# Patient Record
Sex: Male | Born: 1965 | Hispanic: Refuse to answer | Marital: Married | Smoking: Current every day smoker
Health system: Southern US, Community
[De-identification: ages and names within clinical notes are randomized; demographics above are authoritative.]

## PROBLEM LIST (undated history)

## (undated) DIAGNOSIS — N400 Enlarged prostate without lower urinary tract symptoms: Secondary | ICD-10-CM

## (undated) HISTORY — DX: Benign prostatic hyperplasia without lower urinary tract symptoms: N40.0

## (undated) HISTORY — PX: OTHER SURGICAL HISTORY: SHX169

---

## 2003-07-27 ENCOUNTER — Emergency Department (HOSPITAL_COMMUNITY): Admission: EM | Admit: 2003-07-27 | Discharge: 2003-07-27 | Payer: Self-pay | Admitting: Emergency Medicine

## 2005-12-02 ENCOUNTER — Emergency Department: Payer: Self-pay

## 2013-10-15 ENCOUNTER — Emergency Department: Payer: Self-pay | Admitting: Emergency Medicine

## 2014-11-08 ENCOUNTER — Emergency Department: Payer: Managed Care, Other (non HMO)

## 2014-11-08 ENCOUNTER — Other Ambulatory Visit: Payer: Self-pay

## 2014-11-08 ENCOUNTER — Emergency Department
Admission: EM | Admit: 2014-11-08 | Discharge: 2014-11-08 | Disposition: A | Discharge status: Home / Self Care | Payer: Managed Care, Other (non HMO) | Attending: Emergency Medicine | Admitting: Emergency Medicine

## 2014-11-08 ENCOUNTER — Encounter: Payer: Self-pay | Admitting: Emergency Medicine

## 2014-11-08 DIAGNOSIS — R339 Retention of urine, unspecified: Secondary | ICD-10-CM | POA: Diagnosis present

## 2014-11-08 DIAGNOSIS — Z720 Tobacco use: Secondary | ICD-10-CM | POA: Diagnosis not present

## 2014-11-08 DIAGNOSIS — N320 Bladder-neck obstruction: Secondary | ICD-10-CM | POA: Insufficient documentation

## 2014-11-08 LAB — BASIC METABOLIC PANEL
ANION GAP: 13 (ref 5–15)
BUN: 11 mg/dL (ref 6–20)
CO2: 20 mmol/L — ABNORMAL LOW (ref 22–32)
Calcium: 9.3 mg/dL (ref 8.9–10.3)
Chloride: 105 mmol/L (ref 101–111)
Creatinine, Ser: 0.91 mg/dL (ref 0.61–1.24)
GFR calc non Af Amer: >60 mL/min (ref >60)
Glucose, Bld: 105 mg/dL — ABNORMAL HIGH (ref 65–99)
Potassium: 3.8 mmol/L (ref 3.5–5.1)
Sodium: 138 mmol/L (ref 135–145)

## 2014-11-08 LAB — URINALYSIS COMPLETE WITH MICROSCOPIC (ARMC ONLY)
BACTERIA UA: NONE SEEN
Bilirubin Urine: NEGATIVE
Glucose, UA: NEGATIVE mg/dL
Ketones, ur: NEGATIVE mg/dL
LEUKOCYTES UA: NEGATIVE
NITRITE: NEGATIVE
PROTEIN: NEGATIVE mg/dL
Specific Gravity, Urine: 1.012 (ref 1.005–1.030)
Squamous Epithelial / LPF: NONE SEEN
pH: 6 (ref 5.0–8.0)

## 2014-11-08 MED ORDER — TAMSULOSIN HCL 0.4 MG PO CAPS
ORAL_CAPSULE | ORAL | Status: AC
  Administered 2014-11-08: 0.4 mg via ORAL
  Filled 2014-11-08: qty 1

## 2014-11-08 MED ORDER — CEPHALEXIN 500 MG PO CAPS
500.0000 mg | ORAL_CAPSULE | ORAL | Status: AC
  Administered 2014-11-08: 500 mg via ORAL

## 2014-11-08 MED ORDER — CEPHALEXIN 500 MG PO CAPS: 500.0000 mg | ORAL_CAPSULE | Freq: Two times a day (BID) | ORAL | Status: DC

## 2014-11-08 MED ORDER — LEVETIRACETAM 500 MG PO TABS
ORAL_TABLET | ORAL | Status: AC
  Filled 2014-11-08: qty 1

## 2014-11-08 MED ORDER — TAMSULOSIN HCL 0.4 MG PO CAPS
0.4000 mg | ORAL_CAPSULE | ORAL | Status: AC
  Administered 2014-11-08: 0.4 mg via ORAL

## 2014-11-08 MED ORDER — CEPHALEXIN 500 MG PO CAPS
ORAL_CAPSULE | ORAL | Status: AC
  Administered 2014-11-08: 500 mg via ORAL
  Filled 2014-11-08: qty 1

## 2014-11-08 MED ORDER — TAMSULOSIN HCL 0.4 MG PO CAPS: 0.4000 mg | ORAL_CAPSULE | Freq: Every day | ORAL | Status: DC

## 2014-11-08 NOTE — ED Notes (Signed)
D/c instructions reviewed w/ pt - pt denies any further questions or concerns at present.  Educated patient on foley catheter care. Demonstrated how to switch between the urinary leg bag and bed bag. Patient and spouse verbalized understanding.

## 2014-11-08 NOTE — ED Notes (Addendum)
Pt reports feeling relief post in and out cath

## 2014-11-08 NOTE — ED Provider Notes (Addendum)
[facility] Emergency Department Provider Note  ____________________________________________  Time seen: Approximately 1:50 PM  I have reviewed the triage vital signs and the nursing notes.   HISTORY  Chief Complaint Urinary Retention    HPI Douglas Collier is a 49 y.o. male who states he woke up around 1 the morning with difficulty urinating. He is only urinate small drops. States he was having no problems urinating over the last several days. No fevers or chills. No back pain. Does have a history of kidney stones, but denies having any blood in his urine or symptoms of a kidney stone.  Otherwise, the patient has no significant medical history. 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     History reviewed. No pertinent past medical history.  There are no active problems to display for this patient.   History reviewed. No pertinent past surgical history.  No current outpatient prescriptions on file.  Allergies Codeine  History reviewed. No pertinent family history.  Social History History  Substance Use Topics  . Smoking status: Current Every Day Smoker  . Smokeless tobacco: Not on file  . Alcohol Use: No    Review of Systems Constitutional: No fever/chills Eyes: No visual changes. ENT: No sore throat. Cardiovascular: Denies chest pain. Respiratory: Denies shortness of breath. Gastrointestinal: No abdominal pain, states he feels like he needs to urinate.  No nausea, no vomiting.  No diarrhea.  No constipation. Genitourinary: No following or urine, no blood in his urine, no pain with urination. States he just can't name. Musculoskeletal: Negative for back pain. Skin: Negative for rash. Neurological: Negative for headaches, focal weakness or numbness.  10-point ROS otherwise negative.  ____________________________________________   PHYSICAL EXAM:  VITAL SIGNS: ED Triage Vitals  Enc Vitals Group     BP 11/08/14  1323 131/103 mmHg     Pulse Rate 11/08/14 1323 103     Resp 11/08/14 1323 24     Temp 11/08/14 1323 97.4 F (36.3 C)     Temp Source 11/08/14 1323 Oral     SpO2 11/08/14 1323 90 %     Weight 11/08/14 1323 225 lb (102.059 kg)     Height 11/08/14 1323 6\' 1"  (1.854 m)     Head Cir --      Peak Flow --      Pain Score 11/08/14 1314 10     Pain Loc --      Pain Edu? --      Excl. in GC? --     Constitutional: Alert and oriented. Well appearing and in no acute distress. Eyes: Conjunctivae are normal. PERRL. EOMI. Head: Atraumatic. Nose: No congestion/rhinnorhea. Mouth/Throat: Mucous membranes are moist.  Oropharynx non-erythematous. Neck: No stridor.   Cardiovascular: Normal rate, regular rhythm. Grossly normal heart sounds.  Good peripheral circulation. Respiratory: Normal respiratory effort.  No retractions. Lungs CTAB. Gastrointestinal: Soft and nontender. No distention. No abdominal bruits. No CVA tenderness. Genitourinary: Normal-appearing circumcised penis. Testicles nontender and descended bilaterally. There is fullness and prominence over the suprapubic area, and palpation makes the patient feels that he needs to urinate. Musculoskeletal: No lower extremity tenderness nor edema.  No joint effusions. Neurologic:  Normal speech and language. No gross focal neurologic deficits are appreciated. Speech is normal.  Skin:  Skin is warm, dry and intact. No rash noted. Psychiatric: Mood and affect are normal. Speech and behavior are normal.  ____________________________________________   LABS (all labs ordered are listed, but  only abnormal results are displayed)  Labs Reviewed  BASIC METABOLIC PANEL - Abnormal; Notable for the following:    CO2 20 (*)    Glucose, Bld 105 (*)    All other components within normal limits  URINALYSIS COMPLETEWITH MICROSCOPIC ([facility] ONLY) - Abnormal; Notable for the following:    Color, Urine YELLOW (*)    APPearance CLEAR (*)    Hgb urine dipstick  2+ (*)    All other components within normal limits   ____________________________________________  EKG   ____________________________________________  RADIOLOGY  CLINICAL DATA: Urinary retention. Bladder outlet obstruction.  EXAM: RENAL / URINARY TRACT ULTRASOUND COMPLETE  COMPARISON: None.  FINDINGS: Right Kidney:  Length: 10.2 cm. Echogenicity within normal limits. No mass or hydronephrosis visualized.  Left Kidney:  Length: 11.6 cm. Echogenicity within normal limits. No mass or hydronephrosis visualized.  Bladder:  The patient was catheterized immediately prior to ultrasound and 1150 cc of urine or removed. At the time of ultrasound, the bladder was completely collapsed.  IMPRESSION: 1. Currently the bladder is empty. The patient was catheterized for drainage of the bladder immediately prior to today's ultrasound. 2. No hydronephrosis or stones. The kidneys appear sonographically unremarkable. ____________________________________________   PROCEDURES  Procedure(s) performed: None  Critical Care performed: No  ____________________________________________   INITIAL IMPRESSION / ASSESSMENT AND PLAN / ED COURSE  Pertinent labs & imaging results that were available during my care of the patient were reviewed by me and considered in my medical decision making (see chart for details).  Patient presents with difficulty voiding. His symptoms that suggest bilateral type obstruction. He does not have any nausea or vomiting. No fevers. He has no previous history of bladder outlet obstruction. Because of a history of kidney stones, but does not seem to have symptoms of. At this point, most likely etiologies include prostate enlargement, prostate cancer, bladder tumor, or other obstructive etiology.  This is the first time this happened, we will in an out the patient. Trial voiding thereafter, the patient is unable to we will have to insert a Foley  catheter.  I'll obtain renal ultrasound and basic metabolic panel to check creatinine and urinalysis. ____________________________________________ ----------------------------------------- 2:57 PM on 11/08/2014 -----------------------------------------  Patient catheterized for large volume of clear yellow urine. His symptoms have resolved after catheterization. We will attempt to allow the patient to urinate, should he have a large postvoid residual or be unable to urinate again we will insert a Foley catheter. Urinalysis does not suggest infection. Creatinine normal.  ----------------------------------------- 4:03 PM on 11/08/2014 -----------------------------------------  Patient reports that he again is feeling an urge to urinate, but in able. He states he is having a slight trickle again. At this point I would say the patient has failed attempt at voiding. We will insert a Foley catheter. I'll put him on prophylactic cephalexin and Flomax. I described to the patient treatment recommendations, return precautions, and follow up the urology. He agrees to follow up with urology. In addition, he'll return to the ER or acute care clinic for reevaluation in a week. Return precautions advised.  Did discuss with the patient importance of urology care as he does need further evaluation to make sure that he does not have a bladder tumor or mass such as early signs of prostate cancer which could also be causing his symptoms.  FINAL CLINICAL IMPRESSION(S) / ED DIAGNOSES  Final diagnoses:  Bladder outlet obstruction   Discharge. Much improved.   Sharyn Creamer, MD 11/08/14 1606  The patient did  request a work note. I will provide this. The patient did wish for me to specifically comment that he has a catheter, which I have placed on his work now with his permission.  Sharyn Creamer, MD 11/08/14 [phone]

## 2014-11-08 NOTE — ED Notes (Signed)
Unable to urinate since 1am

## 2014-11-08 NOTE — ED Notes (Signed)
Dr. Fanny Bien notified that in and out catheter was preformed.  1150 ml of urine drained.  72 ml of urine remains per bladder scan results.

## 2014-11-08 NOTE — ED Notes (Signed)
Pt reports this morning being unable to void.  He reports straining and only getting drips of urine to pass.  Pt complains of fullness in lower abdomen.  Patient last normal BM was 11-07-14.  Pt reports small BM this morning but since he has been straining to urinate he has blood per rectum when he wipes.

## 2014-11-08 NOTE — Discharge Instructions (Signed)
Please return to the ER or see urology in one week for reevaluation of your catheter and evaluation for removal.  Return to the ER right away if he develop fever, feel you  cant urinate again, have vomiting, abdominal pain, or other new concerns arise.

## 2014-11-14 ENCOUNTER — Encounter: Payer: Self-pay | Admitting: Urology

## 2014-11-14 ENCOUNTER — Ambulatory Visit (INDEPENDENT_AMBULATORY_CARE_PROVIDER_SITE_OTHER): Payer: Managed Care, Other (non HMO) | Admitting: Urology

## 2014-11-14 VITALS — BP 138/91 | HR 92 | Ht 73.0 in | Wt 227.0 lb

## 2014-11-14 DIAGNOSIS — N400 Enlarged prostate without lower urinary tract symptoms: Secondary | ICD-10-CM | POA: Diagnosis not present

## 2014-11-14 DIAGNOSIS — N401 Enlarged prostate with lower urinary tract symptoms: Secondary | ICD-10-CM | POA: Insufficient documentation

## 2014-11-14 DIAGNOSIS — R338 Other retention of urine: Secondary | ICD-10-CM | POA: Insufficient documentation

## 2014-11-14 MED ORDER — FINASTERIDE 5 MG PO TABS: 5.0000 mg | ORAL_TABLET | Freq: Every day | ORAL | Status: DC

## 2014-11-14 MED ORDER — TAMSULOSIN HCL 0.4 MG PO CAPS: 0.4000 mg | ORAL_CAPSULE | Freq: Every day | ORAL | Status: DC

## 2014-11-14 NOTE — Progress Notes (Signed)
Catheter Removal  Patient is present today for a catheter removal.  4ml of water was drained from the balloon. A 16FR foley cath was removed from the bladder no complications were noted . Patient tolerated well.  Preformed by: Natividad Brood, CMA  Follow up/ Additional notes: Pt. was instructed to call the clinic if he is unable to void or has difficulty voiding.

## 2014-11-14 NOTE — Progress Notes (Signed)
11/14/2014 8:16 AM   Douglas Collier Douglas Collier Jun 26, 1965 [id number]  Referring provider: No referring provider defined for this encounter.  Chief Complaint  Patient presents with  . Urinary Retention    Follow up from ER pt has foley    HPI: Mr. Douglas Collier is a 49 year old white male who presented to the ED on 11/08/2014 with the complete inability to urinate.  He states he was having no problems urinating prior to this incident.  He woke up that morning and went to urinate and just a dribble came out.  He waited for a minute and the dribbling just continued.  Then he started to develop suprapubic pain and went to the [facility] ED.  A foley was placed  and he had 1150 cc drained from his bladder.  He was placed on Flomax and discharged with the foley catheter and instructed to follow up with [place].    He has not had any UTI's, fevers, chills, nausea, vomiting or gross hematuria.  Patient's wife stated that he had a similar incident about four years ago, but he was able to urinate on his own after a few hours.   His IPSS score today is 5/3 (mild).  He is mixed about his urinary symptoms today because he wants the catheter out.  He is still taking the Flomax.    He has no PCP and has never had a prostate exam or a PSA.          IPSS      11/14/14 0800       International Prostate Symptom Score   How often have you had the sensation of not emptying your bladder? Not at All     How often have you had to urinate less than every two hours? Not at All     How often have you found you stopped and started again several times when you urinated? Not at All     How often have you found it difficult to postpone urination? Less than 1 in 5 times     How often have you had a weak urinary stream? Less than half the time     How often have you had to strain to start urination? Not at All     How many times did you typically get up at night to urinate? 1 Time     Total IPSS Score 4     Quality of Life due to  urinary symptoms   If you were to spend the rest of your life with your urinary condition just the way it is now how would you feel about that? Mixed        Score:  1-7 Mild 8-19 Moderate 20-35 Severe  PMH: No past medical history on file.  Surgical History: No past surgical history on file.  Home Medications:    Medication List       This list is accurate as of: 11/14/14  8:16 AM.  Always use your most recent med list.               cephALEXin 500 MG capsule  Commonly known as:  KEFLEX  Take 1 capsule (500 mg total) by mouth 2 (two) times daily.     tamsulosin 0.4 MG Caps capsule  Commonly known as:  FLOMAX  Take 1 capsule (0.4 mg total) by mouth daily.        Allergies:  Allergies  Allergen Reactions  . Codeine Rash  Family History: No family history on file.  Social History:  reports that he has been smoking.  He does not have any smokeless tobacco history on file. He reports that he does not drink alcohol. His drug history is not on file.  ROS: Urological Symptom Review  Patient is experiencing the following symptoms: Hard to postpone urination Get up at night to urinate Have to strain to urinate Weak stream   Review of Systems  Gastrointestinal (upper)  : Negative for upper GI symptoms  Gastrointestinal (lower) : Negative for lower GI symptoms  Constitutional : Negative for symptoms  Skin: Negative for skin symptoms  Eyes: Negative for eye symptoms  Ear/Nose/Throat : Negative for Ear/Nose/Throat symptoms  Hematologic/Lymphatic: Negative for Hematologic/Lymphatic symptoms  Cardiovascular : Negative for cardiovascular symptoms  Respiratory : Negative for respiratory symptoms  Endocrine: Negative for endocrine symptoms  Musculoskeletal: Negative for musculoskeletal symptoms  Neurological: Negative for neurological symptoms  Psychologic: Negative for psychiatric symptoms   Physical Exam: BP 138/91 mmHg  Pulse 92   Ht  (1.854 m)  Wt 227 lb (102.967 kg)  BMI 29.96 kg/m2  Constitutional:  Alert and oriented, No acute distress. HEENT: [name], moist mucus membranes.  Trachea midline, no masses. Cardiovascular: No clubbing, cyanosis, or edema. Respiratory: Normal respiratory effort, no increased work of breathing. GI: Abdomen is soft, nontender, nondistended, no abdominal masses GU: No CVA tenderness.  Patient with circumcised phallus. Foley catheter in place. Urethral meatus is patent.  No penile discharge. No penile lesions or rashes. Scrotum without lesions, cysts, rashes and/or edema.  Testicles are located scrotally bilaterally. No masses are appreciated in the testicles. Left and right epididymis are normal.  Rectal: Patient with  normal sphincter tone. Perineum without scarring or rashes. No rectal masses are appreciated. Friable external hemorrhoids.  Prostate is approximately 60 grams, no nodules are appreciated. Seminal vesicles are normal. Skin: No rashes, bruises or suspicious lesions. Lymph: No cervical or inguinal adenopathy. Neurologic: Grossly intact, no focal deficits, moving all 4 extremities. Psychiatric: Normal mood and affect.  Laboratory Data: No results found for: WBC, HGB, HCT, MCV, PLT  Lab Results  Component Value Date   CREATININE 0.91 11/08/2014    No results found for: PSA  No results found for: TESTOSTERONE  No results found for: HGBA1C  Urinalysis    Component Value Date/Time   COLORURINE YELLOW* 11/08/2014 1355   APPEARANCEUR CLEAR* 11/08/2014 1355   LABSPEC 1.012 11/08/2014 1355   PHURINE 6.0 11/08/2014 1355   GLUCOSEU NEGATIVE 11/08/2014 1355   HGBUR 2+* 11/08/2014 1355   BILIRUBINUR NEGATIVE 11/08/2014 1355   KETONESUR NEGATIVE 11/08/2014 1355   PROTEINUR NEGATIVE 11/08/2014 1355   NITRITE NEGATIVE 11/08/2014 1355   LEUKOCYTESUR NEGATIVE 11/08/2014 1355    Pertinent Imaging: CLINICAL DATA: Urinary retention. Bladder outlet  obstruction.  EXAM: RENAL / URINARY TRACT ULTRASOUND COMPLETE  COMPARISON: None.  FINDINGS: Right Kidney:  Length: 10.2 cm. Echogenicity within normal limits. No mass or hydronephrosis visualized.  Left Kidney:  Length: 11.6 cm. Echogenicity within normal limits. No mass or hydronephrosis visualized.  Bladder:  The patient was catheterized immediately prior to ultrasound and 1150 cc of urine or removed. At the time of ultrasound, the bladder was completely collapsed.  IMPRESSION: 1. Currently the bladder is empty. The patient was catheterized for drainage of the bladder immediately prior to today's ultrasound. 2. No hydronephrosis or stones. The kidneys appear sonographically unremarkable.   Electronically Signed  By: Gaylyn Rong M.D.  On: 11/08/2014 14:55  Assessment & Plan:    1. Acute urinary retention:  Patient had a foley catheter placed in the ED on 11/08/2014 with a return of 1150 cc of urine.  Patient had a similar episode about four years ago, but he was eventually was able to urinate on his own.  He is an over the road trunk driver.  He does not have a PCP.  He has no prior PSA's and he has not had his prostate examined in the past.    2. BPH with urinary retention:  Patient was placed on Flomax by the ED and he will continue to take that medication.  Patient's IPSS score is 5/3.  His DRE demonstrates enlargement.  We will add finasteride.  The side effects of finasteride are also discussed with the patient, such as: impotence, loss of interest in sex, or trouble having an orgasm; abnormal ejaculation; swelling in your hands and/or feet; swelling and/or tenderness in your breasts.  His foley catheter is removed today.   He is instructed to return to the office this afternoon if he is unable to void.  He will follow up in one month for a PSA, PVR and an IPSS.     There are no diagnoses linked to this encounter.  No Follow-up on  file.  Michiel Cowboy, PA-C  [facility] [address], [address] [place], [place] [address] [phone]

## 2014-12-16 ENCOUNTER — Ambulatory Visit (INDEPENDENT_AMBULATORY_CARE_PROVIDER_SITE_OTHER): Payer: Managed Care, Other (non HMO) | Admitting: Urology

## 2014-12-16 ENCOUNTER — Encounter: Payer: Self-pay | Admitting: Urology

## 2014-12-16 VITALS — BP 134/81 | HR 90 | Ht 73.0 in | Wt 232.0 lb

## 2014-12-16 DIAGNOSIS — N401 Enlarged prostate with lower urinary tract symptoms: Secondary | ICD-10-CM

## 2014-12-16 DIAGNOSIS — N400 Enlarged prostate without lower urinary tract symptoms: Secondary | ICD-10-CM | POA: Diagnosis not present

## 2014-12-16 DIAGNOSIS — R338 Other retention of urine: Principal | ICD-10-CM

## 2014-12-16 LAB — BLADDER SCAN AMB NON-IMAGING

## 2014-12-16 NOTE — Progress Notes (Signed)
12/16/2014 3:17 PM   Douglas Collier [birth year] [id number]  Referring provider: Hillery Aldo, MD [address] [place], [place] [address]  Chief Complaint  Patient presents with  . Benign Prostatic Hypertrophy    45month    HPI: Douglas Collier is a 49 year old white male who is ended to [place] 1 month ago after having a Foley catheter placed for urinary retention. I started him on finasteride and tamsulosin and he presents today for symptom recheck and PVR.  His IPSS score today is 4, which is mild lower urinary tract symptomatology. He is pleased with his quality life due to his urinary symptoms. His PVR is 44 mL.  His previous IPSS score was 4/3.    His states he is urinating well without difficulty. He is also notices nocturia has diminished.   He denies any dysuria, hematuria or suprapubic pain.   Patient does not have any previous PSA's:     He also denies any recent fevers, chills, nausea or vomiting.  He does not have a family history of PCa.      IPSS      11/14/14 0800 12/16/14 1500     International Prostate Symptom Score   How often have you had the sensation of not emptying your bladder? Not at All Not at All    How often have you had to urinate less than every two hours? Not at All Less than half the time    How often have you found you stopped and started again several times when you urinated? Not at All Not at All    How often have you found it difficult to postpone urination? Less than 1 in 5 times Not at All    How often have you had a weak urinary stream? Less than half the time Not at All    How often have you had to strain to start urination? Not at All Not at All    How many times did you typically get up at night to urinate? 1 Time 2 Times    Total IPSS Score 4 4    Quality of Life due to urinary symptoms   If you were to spend the rest of your life with your urinary condition just the way it is now how would you feel about that? Mixed Pleased        Score:  1-7 Mild 8-19 Moderate 20-35 Severe     PMH: No past medical history on file.  Surgical History: Past Surgical History  Procedure Laterality Date  . Arm surgery Left     radius/ulnar    Home Medications:    Medication List       This list is accurate as of: 12/16/14  3:17 PM.  Always use your most recent med list.               aspirin 81 MG tablet  Take 81 mg by mouth daily.     finasteride 5 MG tablet  Commonly known as:  PROSCAR  Take 1 tablet (5 mg total) by mouth daily.     tamsulosin 0.4 MG Caps capsule  Commonly known as:  FLOMAX  Take 1 capsule (0.4 mg total) by mouth daily.        Allergies:  Allergies  Allergen Reactions  . Codeine Rash    Family History: Family History  Problem Relation Age of Onset  . Other Mother     Genetic Disorder also 2  Brothers  . Cancer Neg Hx     Kidney Disease  . Cancer Neg Hx     Prostate Disease    Social History:  reports that he has been smoking.  He does not have any smokeless tobacco history on file. He reports that he does not drink alcohol or use illicit drugs.  ROS: UROLOGY Frequent Urination?: No Hard to postpone urination?: No Burning/pain with urination?: No Get up at night to urinate?: No Leakage of urine?: No Urine stream starts and stops?: No Trouble starting stream?: No Do you have to strain to urinate?: No Blood in urine?: No Urinary tract infection?: No Sexually transmitted disease?: No Injury to kidneys or bladder?: No Painful intercourse?: No Weak stream?: No Erection problems?: No Penile pain?: No  Gastrointestinal Nausea?: No Vomiting?: No Indigestion/heartburn?: No Diarrhea?: No Constipation?: No  Constitutional Fever: No Night sweats?: No Weight loss?: No Fatigue?: No  Skin Skin rash/lesions?: No Itching?: No  Eyes Blurred vision?: No Double vision?: No  Ears/Nose/Throat Sore throat?: No Sinus problems?: No  Hematologic/Lymphatic Swollen  glands?: No Easy bruising?: No  Cardiovascular Leg swelling?: No Chest pain?: No  Respiratory Cough?: No Shortness of breath?: No  Endocrine Excessive thirst?: No  Musculoskeletal Back pain?: No Joint pain?: No  Neurological Headaches?: No Dizziness?: No  Psychologic Depression?: No Anxiety?: No  Physical Exam: BP 134/81 mmHg  Pulse 90  Ht  (1.854 m)  Wt 232 lb (105.235 kg)  BMI 30.62 kg/m2   Laboratory Data: Results for orders placed or performed in visit on 12/16/14  BLADDER SCAN AMB NON-IMAGING  Result Value Ref Range   Scan Result 44ml    No results found for: WBC, HGB, HCT, MCV, PLT  Lab Results  Component Value Date   CREATININE 0.91 11/08/2014    No results found for: PSA  No results found for: TESTOSTERONE  No results found for: HGBA1C  Urinalysis    Component Value Date/Time   COLORURINE YELLOW* 11/08/2014 1355   APPEARANCEUR CLEAR* 11/08/2014 1355   LABSPEC 1.012 11/08/2014 1355   PHURINE 6.0 11/08/2014 1355   GLUCOSEU NEGATIVE 11/08/2014 1355   HGBUR 2+* 11/08/2014 1355   BILIRUBINUR NEGATIVE 11/08/2014 1355   KETONESUR NEGATIVE 11/08/2014 1355   PROTEINUR NEGATIVE 11/08/2014 1355   NITRITE NEGATIVE 11/08/2014 1355   LEUKOCYTESUR NEGATIVE 11/08/2014 1355    Pertinent Imaging:   Assessment & Plan:    1. BPH (benign prostatic hypertrophy) with urinary retention:   Patient's IPSS score is 4/2.  His PVR 44 mL.  His DRE demonstrates enlargement. If his PSA is normal for his age group,  he will follow up in one year for a PSA, DRE, PVR and an IPSS.    - PSA - BLADDER SCAN AMB NON-IMAGING   No Follow-up on file.  Michiel Cowboy, PA-C  [facility] [address], [address] [place], [place] [address] [phone]

## 2014-12-17 ENCOUNTER — Telehealth: Payer: Self-pay

## 2014-12-17 DIAGNOSIS — R972 Elevated prostate specific antigen [PSA]: Secondary | ICD-10-CM

## 2014-12-17 LAB — PSA: PROSTATE SPECIFIC AG, SERUM: 2 ng/mL (ref 0.0–4.0)

## 2014-12-17 NOTE — Telephone Encounter (Signed)
Spoke with pt wife in reference to lab results. Wife voiced understanding. Orders have been placed.

## 2014-12-17 NOTE — Telephone Encounter (Signed)
-----   Message from Harle Battiest, PA-C sent at 12/17/2014  8:00 AM EDT ----- Patient's PSA is stable.  We will see him in 12 months.  PSA to be drawn before his next appointment.

## 2015-11-25 ENCOUNTER — Other Ambulatory Visit: Payer: Self-pay | Admitting: Urology

## 2015-12-15 ENCOUNTER — Other Ambulatory Visit: Payer: Self-pay

## 2015-12-15 DIAGNOSIS — N400 Enlarged prostate without lower urinary tract symptoms: Secondary | ICD-10-CM

## 2015-12-16 ENCOUNTER — Other Ambulatory Visit: Payer: Managed Care, Other (non HMO)

## 2015-12-16 DIAGNOSIS — N400 Enlarged prostate without lower urinary tract symptoms: Secondary | ICD-10-CM

## 2015-12-17 LAB — PSA: Prostate Specific Ag, Serum: 1.8 ng/mL (ref 0.0–4.0)

## 2015-12-21 NOTE — Progress Notes (Signed)
12/22/2015 4:05 PM   Douglas Collier December 30, 1965 [id number]  Referring provider: Hillery Aldo, MD [address] [place], [place] [address]  Chief Complaint  Patient presents with  . Benign Prostatic Hypertrophy    1 year follow up     HPI: Patient is a 50 year old Caucasian male with a history of urinary retention and BPH with LUTS.  History of urinary retention Patient had an incident of urinary retention that required Foley catheterization over one year ago.  He has been on finasteride and tamsulosin and is voiding well.  BPH WITH LUTS His IPSS score today is 1, which is mild lower urinary tract symptomatology. He is mostly satisfied with his quality life due to his urinary symptoms.  His previous IPSS score was 4/2.  His previous PVR is 44 mL.  His major complaint today is ED.  He has had these symptoms for several years.  He denies any dysuria, hematuria or suprapubic pain.  He currently taking tamsulosin 0.4 mg daily and finasteride 5 mg daily.  He also denies any recent fevers, chills, nausea or vomiting.  He does not have a family history of PCa.      IPSS    Row Name 12/22/15 1500         International Prostate Symptom Score   How often have you had the sensation of not emptying your bladder? Not at All     How often have you had to urinate less than every two hours? Not at All     How often have you found you stopped and started again several times when you urinated? Not at All     How often have you found it difficult to postpone urination? Not at All     How often have you had a weak urinary stream? Not at All     How often have you had to strain to start urination? Not at All     How many times did you typically get up at night to urinate? 1 Time     Total IPSS Score 1       Quality of Life due to urinary symptoms   If you were to spend the rest of your life with your urinary condition just the way it is now how would you feel about that? Mostly  Satisfied        Score:  1-7 Mild 8-19 Moderate 20-35 Severe     PMH: Past Medical History:  Diagnosis Date  . BPH (benign prostatic hyperplasia)     Surgical History: Past Surgical History:  Procedure Laterality Date  . Arm surgery Left    radius/ulnar    Home Medications:    Medication List       Accurate as of 12/22/15  4:05 PM. Always use your most recent med list.          aspirin 81 MG tablet Take 81 mg by mouth daily.   finasteride 5 MG tablet Commonly known as:  PROSCAR TAKE 1 TABLET (5 MG TOTAL) BY MOUTH DAILY.   tamsulosin 0.4 MG Caps capsule Commonly known as:  FLOMAX TAKE 1 CAPSULE (0.4 MG TOTAL) BY MOUTH DAILY.       Allergies:  Allergies  Allergen Reactions  . Codeine Rash    Family History: Family History  Problem Relation Age of Onset  . Other Mother     Genetic Disorder also 2 Brothers  . Cancer Neg Hx     Kidney Disease  Social History:  reports that he has been smoking.  He has never used smokeless tobacco. He reports that he does not drink alcohol or use drugs.  ROS: UROLOGY Frequent Urination?: No Hard to postpone urination?: No Burning/pain with urination?: No Get up at night to urinate?: No Leakage of urine?: No Urine stream starts and stops?: No Trouble starting stream?: No Do you have to strain to urinate?: No Blood in urine?: No Urinary tract infection?: No Sexually transmitted disease?: No Injury to kidneys or bladder?: No Painful intercourse?: No Weak stream?: No Erection problems?: Yes Penile pain?: No  Gastrointestinal Nausea?: No Vomiting?: No Indigestion/heartburn?: No Diarrhea?: No Constipation?: No  Constitutional Fever: No Night sweats?: No Weight loss?: No Fatigue?: No  Skin Skin rash/lesions?: No Itching?: No  Eyes Blurred vision?: No Double vision?: No  Ears/Nose/Throat Sore throat?: No Sinus problems?: No  Hematologic/Lymphatic Swollen glands?: No Easy bruising?:  No  Cardiovascular Leg swelling?: No Chest pain?: No  Respiratory Cough?: No Shortness of breath?: No  Endocrine Excessive thirst?: No  Musculoskeletal Back pain?: No Joint pain?: No  Neurological Headaches?: No Dizziness?: No  Psychologic Depression?: No Anxiety?: No  Physical Exam: BP 122/82   Pulse 89   Ht 6' (1.829 m)   Wt 221 lb 1.6 oz (100.3 kg)   BMI 29.99 kg/m   Constitutional: Well nourished. Alert and oriented, No acute distress. HEENT: [place] AT, moist mucus membranes. Trachea midline, no masses. Cardiovascular: No clubbing, cyanosis, or edema. Respiratory: Normal respiratory effort, no increased work of breathing. GI: Abdomen is soft, non tender, non distended, no abdominal masses. Liver and spleen not palpable.  No hernias appreciated.  Stool sample for occult testing is not indicated.   GU: No CVA tenderness.  No bladder fullness or masses.  Patient with circumcised phallus.  Urethral meatus is patent.  No penile discharge. No penile lesions or rashes. Scrotum without lesions, cysts, rashes and/or edema.  Testicles are located scrotally bilaterally. No masses are appreciated in the testicles. Left and right epididymis are normal. Rectal: Refused.   Skin: No rashes, bruises or suspicious lesions. Lymph: No cervical or inguinal adenopathy. Neurologic: Grossly intact, no focal deficits, moving all 4 extremities. Psychiatric: Normal mood and affect.  Laboratory Data: Results for orders placed or performed in visit on 12/16/15  PSA  Result Value Ref Range   Prostate Specific Ag, Serum 1.8 0.0 - 4.0 ng/mL     Lab Results  Component Value Date   CREATININE 0.91 11/08/2014     Assessment & Plan:    1. History of urinary retention:   Voiding well. Continue tamsulosin and finasteride. Return to clinic in 1 year for I PSS score.  2. BPH with LUTS  - IPSS score is 1/2, it is improving  - Continue tamsulosin 0.4 mg daily and finasteride 5 mg daily  -  RTC in 12 months for IPSS, PSA and exam      Return in about 1 year (around 12/21/2016) for IPSS, PSA and exam.  Michiel Cowboy, [facility]  [facility] [address] [place], [place] [address] [phone]

## 2015-12-22 ENCOUNTER — Ambulatory Visit (INDEPENDENT_AMBULATORY_CARE_PROVIDER_SITE_OTHER): Payer: Managed Care, Other (non HMO) | Admitting: Urology

## 2015-12-22 ENCOUNTER — Encounter: Payer: Self-pay | Admitting: Urology

## 2015-12-22 VITALS — BP 122/82 | HR 89 | Ht 72.0 in | Wt 221.1 lb

## 2015-12-22 DIAGNOSIS — Z87448 Personal history of other diseases of urinary system: Secondary | ICD-10-CM | POA: Diagnosis not present

## 2015-12-22 DIAGNOSIS — Z87898 Personal history of other specified conditions: Secondary | ICD-10-CM

## 2015-12-22 DIAGNOSIS — N400 Enlarged prostate without lower urinary tract symptoms: Secondary | ICD-10-CM

## 2015-12-22 DIAGNOSIS — N401 Enlarged prostate with lower urinary tract symptoms: Secondary | ICD-10-CM

## 2015-12-22 DIAGNOSIS — R338 Other retention of urine: Principal | ICD-10-CM

## 2015-12-22 MED ORDER — FINASTERIDE 5 MG PO TABS: ORAL_TABLET | ORAL | 4 refills | Status: DC

## 2015-12-22 MED ORDER — TAMSULOSIN HCL 0.4 MG PO CAPS: ORAL_CAPSULE | ORAL | 4 refills | Status: DC

## 2015-12-26 ENCOUNTER — Telehealth: Payer: Self-pay

## 2015-12-26 NOTE — Telephone Encounter (Signed)
PA for finasteride approved x65yr. Approval #- [id number]

## 2015-12-29 ENCOUNTER — Telehealth: Payer: Self-pay

## 2015-12-29 NOTE — Telephone Encounter (Signed)
PA for finasteride has been APPROVED.  

## 2016-06-30 IMAGING — US US RENAL
1 series · 14 of 25 positions shown · non-contrast
Comparison: None.

CLINICAL DATA: Urinary retention.  Bladder outlet obstruction.

EXAM:
RENAL / URINARY TRACT ULTRASOUND COMPLETE

[Series 1: us renal · 0.24mm/px · 14 of 25 slices shown]
[im 1/25]
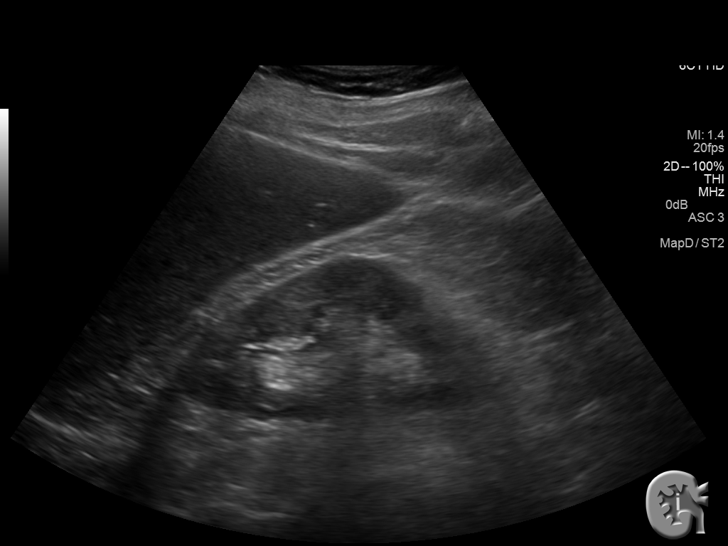
[im 3/25]
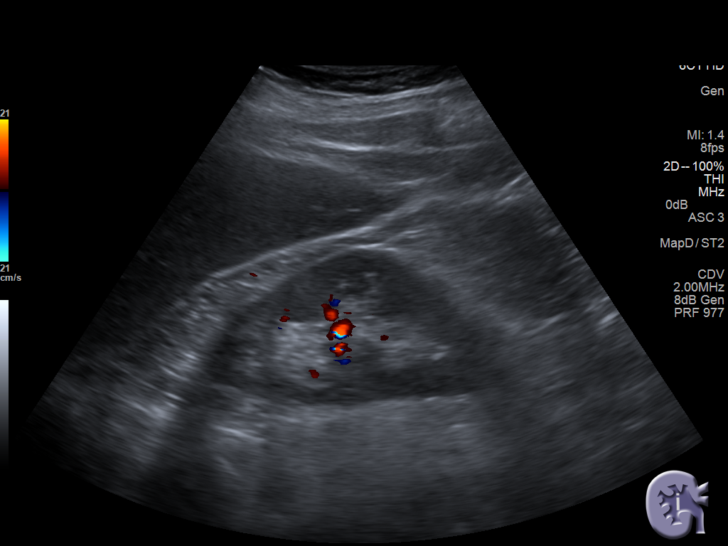
[im 5/25]
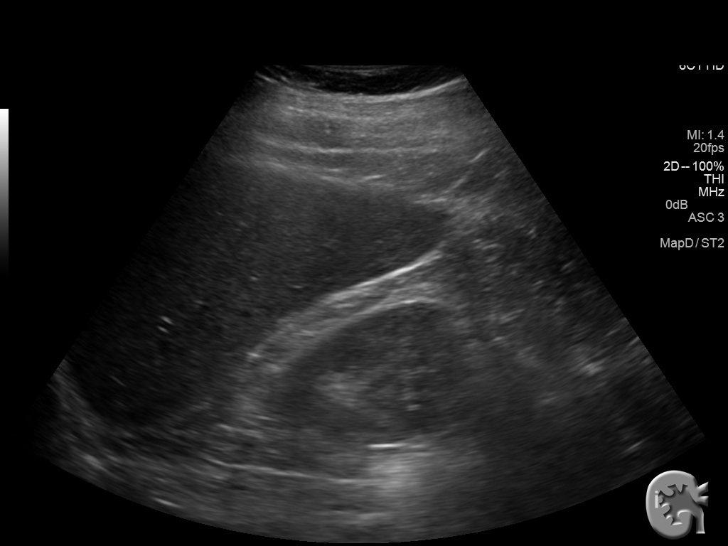
[im 7/25]
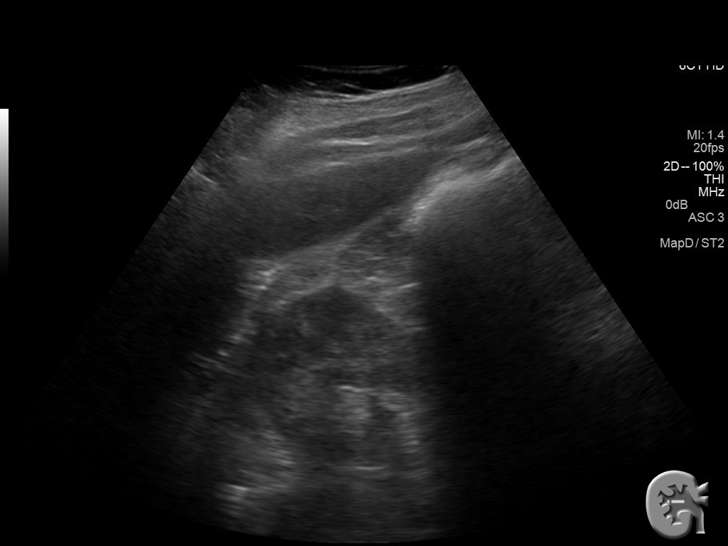
[im 9/25]
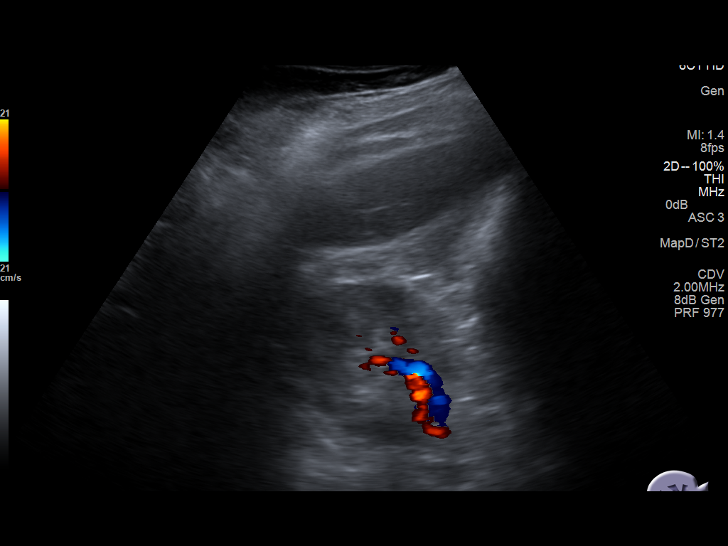
[im 10/25]
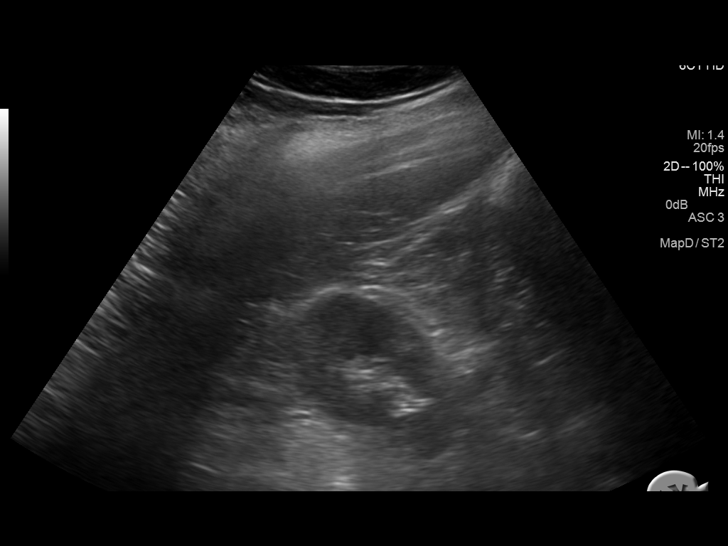
[im 12/25]
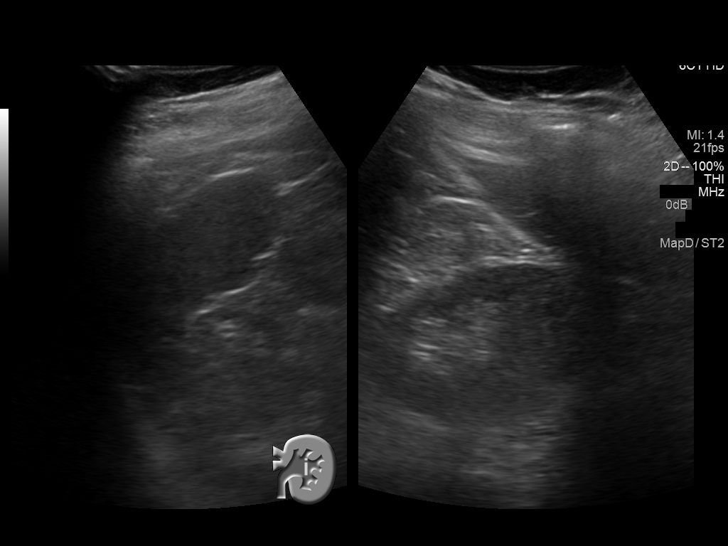
[im 14/25]
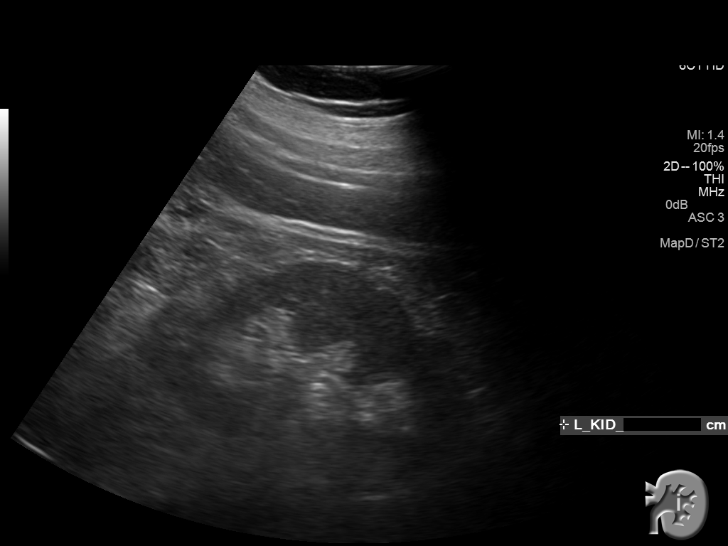
[im 16/25]
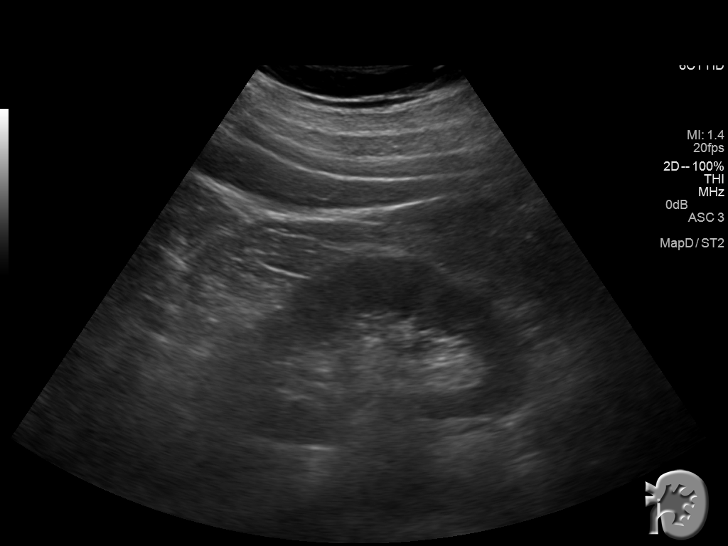
[im 17/25]
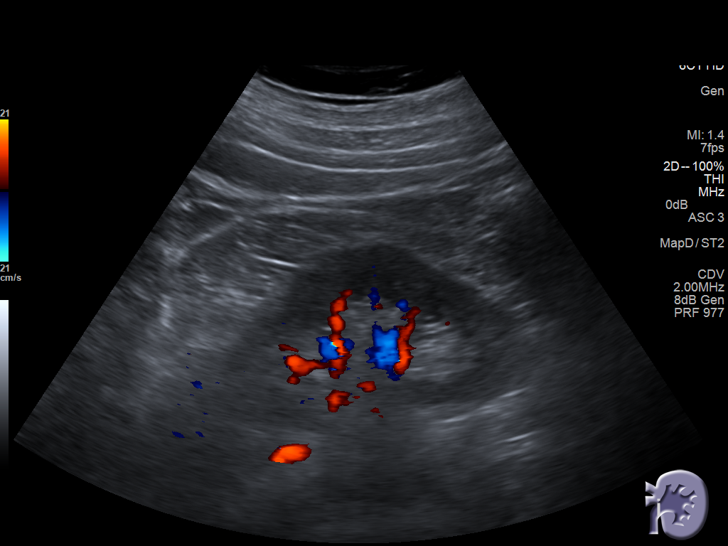
[im 19/25]
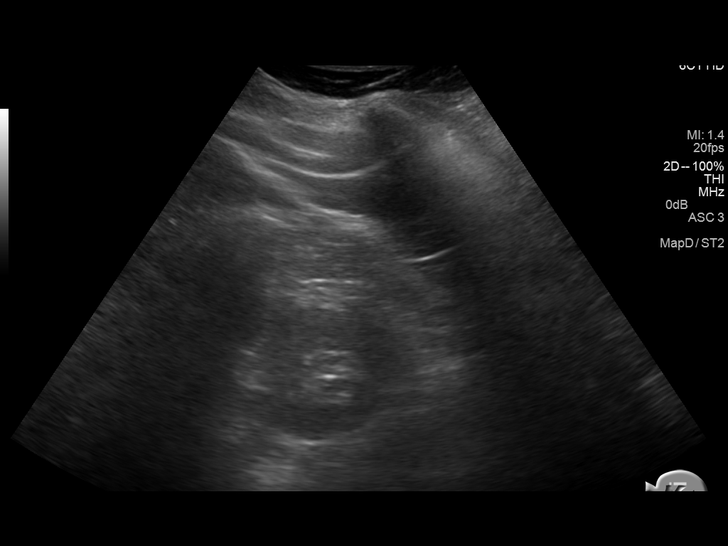
[im 21/25]
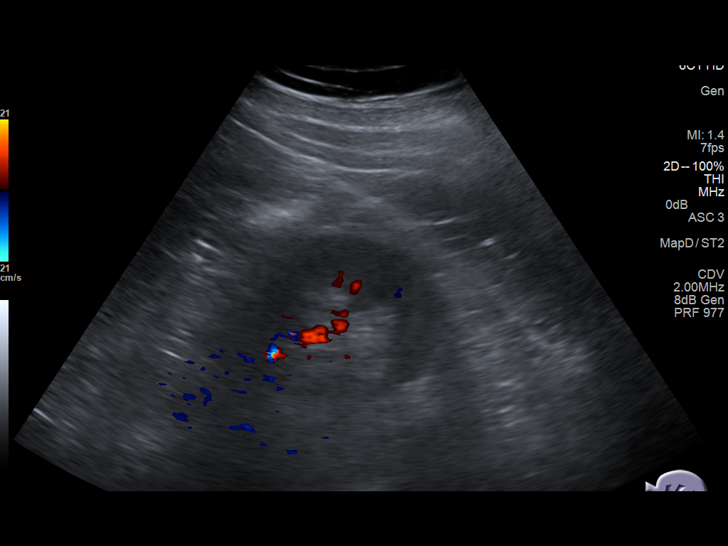
[im 23/25]
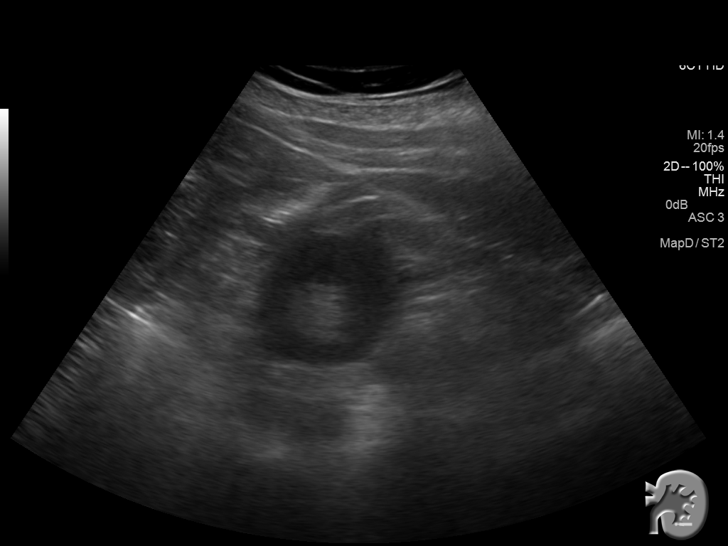
[im 25/25]
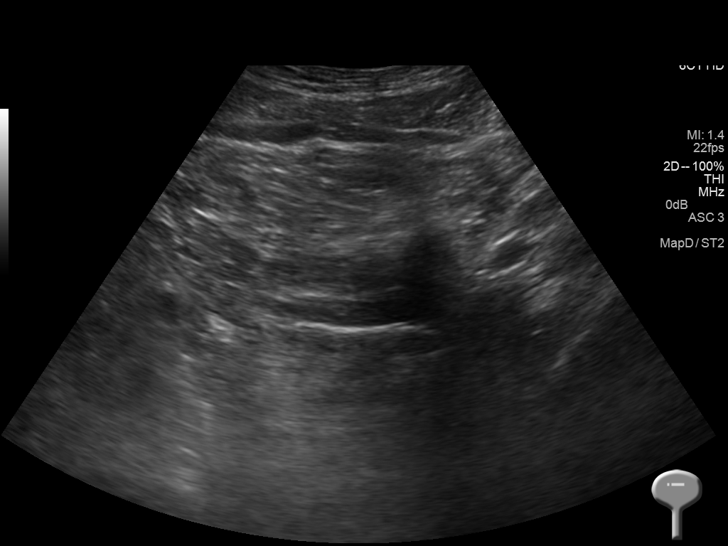

[14 of 25 positions shown; findings below may reference images not displayed]

FINDINGS: Right Kidney:

Length: 10.2 cm. Echogenicity within normal limits. No mass or
hydronephrosis visualized.

Left Kidney:

Length: 11.6 cm. Echogenicity within normal limits. No mass or
hydronephrosis visualized.

Bladder:

The patient was catheterized immediately prior to ultrasound and
5562 cc of urine or removed. At the time of ultrasound, the bladder
was completely collapsed.
IMPRESSION: 1. Currently the bladder is empty. The patient was catheterized for
drainage of the bladder immediately prior to today's ultrasound.
2. No hydronephrosis or stones. The kidneys appear sonographically
unremarkable.

## 2016-12-14 ENCOUNTER — Other Ambulatory Visit: Payer: 59

## 2016-12-14 DIAGNOSIS — N400 Enlarged prostate without lower urinary tract symptoms: Secondary | ICD-10-CM

## 2016-12-15 LAB — PSA: Prostate Specific Ag, Serum: 1.7 ng/mL (ref 0.0–4.0)

## 2016-12-19 NOTE — Progress Notes (Signed)
12/20/2016 1:48 PM   Chales Salmonobert K Darleen CrockerFulks [birth year] [id number]  Referring provider: Hillery AldoPatel, Sarah, MD [address] [place], [place] [address]  Chief Complaint  Patient presents with  . Benign Prostatic Hypertrophy    1year     HPI: Patient is a 51 year old Caucasian male with a history of urinary retention and BPH with LUTS.  History of urinary retention Patient had an incident of urinary retention that required Foley catheterization over one year ago.  He has been on finasteride and tamsulosin and is voiding well.  BPH WITH LUTS His IPSS score today is 2, which is mild lower urinary tract symptomatology. He is delighted with his quality life due to his urinary symptoms.  His previous IPSS score was 1/2.  His previous PVR is 44 mL.  His major complaint today is ED.  He has had these symptoms for several years.  He denies any dysuria, hematuria or suprapubic pain.  He currently taking tamsulosin 0.4 mg daily and finasteride 5 mg daily.  He also denies any recent fevers, chills, nausea or vomiting.  He does not have a family history of PCa.      IPSS    Row Name 12/20/16 1300         International Prostate Symptom Score   How often have you had the sensation of not emptying your bladder? Not at All     How often have you had to urinate less than every two hours? Less than half the time     How often have you found you stopped and started again several times when you urinated? Not at All     How often have you found it difficult to postpone urination? Not at All     How often have you had a weak urinary stream? Not at All     How often have you had to strain to start urination? Not at All     How many times did you typically get up at night to urinate? None     Total IPSS Score 2       Quality of Life due to urinary symptoms   If you were to spend the rest of your life with your urinary condition just the way it is now how would you feel about that? Delighted         Score:  1-7 Mild 8-19 Moderate 20-35 Severe     PMH: Past Medical History:  Diagnosis Date  . BPH (benign prostatic hyperplasia)     Surgical History: Past Surgical History:  Procedure Laterality Date  . Arm surgery Left    radius/ulnar    Home Medications:  Allergies as of 12/20/2016      Reactions   Codeine Rash      Medication List       Accurate as of 12/20/16  1:48 PM. Always use your most recent med list.          aspirin 81 MG tablet Take 81 mg by mouth daily.   finasteride 5 MG tablet Commonly known as:  PROSCAR TAKE 1 TABLET (5 MG TOTAL) BY MOUTH DAILY.   tamsulosin 0.4 MG Caps capsule Commonly known as:  FLOMAX TAKE 1 CAPSULE (0.4 MG TOTAL) BY MOUTH DAILY.       Allergies:  Allergies  Allergen Reactions  . Codeine Rash    Family History: Family History  Problem Relation Age of Onset  . Other Mother  Genetic Disorder also 2 Brothers  . Cancer Neg Hx        Kidney Disease    Social History:  reports that he has been smoking Cigarettes.  He has been smoking about 1.00 pack per day. He has never used smokeless tobacco. He reports that he does not drink alcohol or use drugs.  ROS: UROLOGY Frequent Urination?: No Hard to postpone urination?: No Burning/pain with urination?: No Get up at night to urinate?: No Leakage of urine?: No Urine stream starts and stops?: No Trouble starting stream?: No Do you have to strain to urinate?: No Blood in urine?: No Urinary tract infection?: No Sexually transmitted disease?: No Injury to kidneys or bladder?: No Painful intercourse?: No Weak stream?: No Erection problems?: No Penile pain?: No  Gastrointestinal Nausea?: No Vomiting?: No Indigestion/heartburn?: No Diarrhea?: No Constipation?: No  Constitutional Fever: No Night sweats?: No Weight loss?: No Fatigue?: No  Skin Skin rash/lesions?: No Itching?: No  Eyes Blurred vision?: No Double vision?:  No  Ears/Nose/Throat Sore throat?: No Sinus problems?: No  Hematologic/Lymphatic Swollen glands?: No Easy bruising?: No  Cardiovascular Leg swelling?: No Chest pain?: No  Respiratory Cough?: No Shortness of breath?: No  Endocrine Excessive thirst?: No  Musculoskeletal Back pain?: No Joint pain?: No  Neurological Headaches?: No Dizziness?: No  Psychologic Depression?: No Anxiety?: No  Physical Exam: BP (!) 134/103   Pulse 88   Ht 6' (1.829 m)   Wt 237 lb (107.5 kg)   BMI 32.14 kg/m   Constitutional: Well nourished. Alert and oriented, No acute distress. HEENT: [place] AT, moist mucus membranes. Trachea midline, no masses. Cardiovascular: No clubbing, cyanosis, or edema. Respiratory: Normal respiratory effort, no increased work of breathing. GI: Abdomen is soft, non tender, non distended, no abdominal masses. Liver and spleen not palpable.  No hernias appreciated.  Stool sample for occult testing is not indicated.   GU: No CVA tenderness.  No bladder fullness or masses.  Patient with circumcised phallus.  Urethral meatus is patent.  No penile discharge. No penile lesions or rashes. Scrotum without lesions, cysts, rashes and/or edema.  Testicles are located scrotally bilaterally. No masses are appreciated in the testicles. Left and right epididymis are normal. Rectal: Refused.   Skin: No rashes, bruises or suspicious lesions. Lymph: No cervical or inguinal adenopathy. Neurologic: Grossly intact, no focal deficits, moving all 4 extremities. Psychiatric: Normal mood and affect.  Laboratory Data: Results for orders placed or performed in visit on 12/14/16  PSA  Result Value Ref Range   Prostate Specific Ag, Serum 1.7 0.0 - 4.0 ng/mL   I have reviewed the labs   Assessment & Plan:    1. History of urinary retention:   Voiding well. Continue tamsulosin and finasteride. Return to clinic in 1 year for I PSS score.  2. BPH with LUTS  - IPSS score is 2/0, it is  improving  - Continue tamsulosin 0.4 mg daily and finasteride 5 mg daily  - Patient would like to continue his care with his PCP, Dr. Allena KatzPatel.      Return if symptoms worsen or fail to improve.  Michiel CowboySHANNON Izekiel Flegel, PA-C  [facility] [address], [address] [place], [place] [address] [phone]

## 2016-12-20 ENCOUNTER — Encounter: Payer: Self-pay | Admitting: Urology

## 2016-12-20 ENCOUNTER — Telehealth: Payer: Self-pay | Admitting: Urology

## 2016-12-20 ENCOUNTER — Ambulatory Visit: Payer: 59 | Admitting: Urology

## 2016-12-20 VITALS — BP 134/103 | HR 88 | Ht 72.0 in | Wt 237.0 lb

## 2016-12-20 DIAGNOSIS — N401 Enlarged prostate with lower urinary tract symptoms: Secondary | ICD-10-CM

## 2016-12-20 DIAGNOSIS — N138 Other obstructive and reflux uropathy: Secondary | ICD-10-CM | POA: Diagnosis not present

## 2016-12-20 DIAGNOSIS — Z87898 Personal history of other specified conditions: Secondary | ICD-10-CM

## 2016-12-20 MED ORDER — FINASTERIDE 5 MG PO TABS: ORAL_TABLET | ORAL | 4 refills | Status: DC

## 2016-12-20 MED ORDER — TAMSULOSIN HCL 0.4 MG PO CAPS: ORAL_CAPSULE | ORAL | 4 refills | Status: DC

## 2016-12-20 NOTE — Telephone Encounter (Signed)
Would you send my note to Dr. Allena KatzPatel at [facility]?

## 2016-12-21 ENCOUNTER — Ambulatory Visit: Payer: Managed Care, Other (non HMO) | Admitting: Urology

## 2016-12-21 NOTE — Telephone Encounter (Signed)
Note faxed.

## 2019-03-07 ENCOUNTER — Other Ambulatory Visit: Payer: Self-pay | Admitting: *Deleted

## 2019-03-07 DIAGNOSIS — Z20822 Contact with and (suspected) exposure to covid-19: Secondary | ICD-10-CM

## 2019-03-08 LAB — NOVEL CORONAVIRUS, NAA: SARS-CoV-2, NAA: NOT DETECTED

## 2019-06-18 ENCOUNTER — Other Ambulatory Visit: Payer: Self-pay

## 2022-08-24 ENCOUNTER — Other Ambulatory Visit: Payer: Self-pay | Admitting: Internal Medicine

## 2022-08-24 DIAGNOSIS — E785 Hyperlipidemia, unspecified: Secondary | ICD-10-CM

## 2022-09-08 ENCOUNTER — Other Ambulatory Visit: Payer: Self-pay | Admitting: Internal Medicine

## 2022-09-08 DIAGNOSIS — I10 Essential (primary) hypertension: Secondary | ICD-10-CM

## 2022-09-14 ENCOUNTER — Other Ambulatory Visit: Payer: Self-pay | Admitting: Internal Medicine

## 2022-09-14 ENCOUNTER — Other Ambulatory Visit: Payer: No Typology Code available for payment source

## 2022-09-15 LAB — LIPID PANEL W/O CHOL/HDL RATIO
Cholesterol, Total: 135 mg/dL (ref 100–199)
HDL: 43 mg/dL (ref >39)
LDL Chol Calc (NIH): 66 mg/dL (ref 0–99)
Triglycerides: 152 mg/dL — ABNORMAL HIGH (ref 0–149)
VLDL Cholesterol Cal: 26 mg/dL (ref 5–40)

## 2022-09-15 LAB — HEPATIC FUNCTION PANEL
ALT: 36 IU/L (ref 0–44)
AST: 26 IU/L (ref 0–40)
Albumin: 4.7 g/dL (ref 3.8–4.9)
Alkaline Phosphatase: 68 IU/L (ref 44–121)
Bilirubin Total: 0.5 mg/dL (ref 0.0–1.2)
Bilirubin, Direct: 0.15 mg/dL (ref 0.00–0.40)
Total Protein: 7.1 g/dL (ref 6.0–8.5)

## 2022-09-15 LAB — CK: Total CK: 121 U/L (ref 41–331)

## 2022-09-15 LAB — HGB A1C W/O EAG: Hgb A1c MFr Bld: 6.3 % — ABNORMAL HIGH (ref 4.8–5.6)

## 2022-09-16 ENCOUNTER — Other Ambulatory Visit: Payer: Self-pay | Admitting: Internal Medicine

## 2022-09-16 DIAGNOSIS — N400 Enlarged prostate without lower urinary tract symptoms: Secondary | ICD-10-CM

## 2022-09-20 ENCOUNTER — Ambulatory Visit: Payer: No Typology Code available for payment source | Admitting: Internal Medicine

## 2022-09-20 ENCOUNTER — Other Ambulatory Visit: Payer: Self-pay | Admitting: Internal Medicine

## 2022-09-20 ENCOUNTER — Encounter: Payer: Self-pay | Admitting: Internal Medicine

## 2022-09-20 VITALS — BP 138/84 | HR 73 | Ht 72.0 in | Wt 286.6 lb

## 2022-09-20 DIAGNOSIS — E6609 Other obesity due to excess calories: Secondary | ICD-10-CM

## 2022-09-20 DIAGNOSIS — R600 Localized edema: Secondary | ICD-10-CM | POA: Diagnosis not present

## 2022-09-20 DIAGNOSIS — E8770 Fluid overload, unspecified: Secondary | ICD-10-CM

## 2022-09-20 DIAGNOSIS — E782 Mixed hyperlipidemia: Secondary | ICD-10-CM | POA: Diagnosis not present

## 2022-09-20 DIAGNOSIS — E66812 Obesity, class 2: Secondary | ICD-10-CM

## 2022-09-20 DIAGNOSIS — Z6838 Body mass index (BMI) 38.0-38.9, adult: Secondary | ICD-10-CM

## 2022-09-20 DIAGNOSIS — E119 Type 2 diabetes mellitus without complications: Secondary | ICD-10-CM

## 2022-09-20 NOTE — Progress Notes (Signed)
Established Patient Office Visit  Subjective:  Patient ID: Douglas Collier, male    DOB: [birth year]  Age: 56 y.o. MRN: [id number]  Chief Complaint  Patient presents with   Follow-up    3 month follow up, discuss lab results.    C/o bilateral leg swelling x several weeks and here for lab review and medication refills. Denies SOB, CP, orthopnea or PND.      No other concerns at this time.   Past Medical History:  Diagnosis Date   BPH (benign prostatic hyperplasia)     Past Surgical History:  Procedure Laterality Date   Arm surgery Left    radius/ulnar    Social History   Socioeconomic History   Marital status: Married    Spouse name: Not on file   Number of children: Not on file   Years of education: Not on file   Highest education level: Not on file  Occupational History   Not on file  Tobacco Use   Smoking status: Every Day    Packs/day: 1    Types: Cigarettes   Smokeless tobacco: Never  Substance and Sexual Activity   Alcohol use: No    Alcohol/week: 0.0 standard drinks of alcohol   Drug use: No   Sexual activity: Not on file  Other Topics Concern   Not on file  Social History Narrative   Not on file   Social Determinants of Health   Financial Resource Strain: Not on file  Food Insecurity: Not on file  Transportation Needs: Not on file  Physical Activity: Not on file  Stress: Not on file  Social Connections: Not on file  Intimate Partner Violence: Not on file    Family History  Problem Relation Age of Onset   Other Mother        Genetic Disorder also 2 Brothers   Cancer Neg Hx        Kidney Disease    Allergies  Allergen Reactions   Codeine Rash    ROS     Objective:   BP 138/84   Pulse 73   Ht 6' (1.829 m)   Wt 286 lb 9.6 oz (130 kg)   SpO2 94%   BMI 38.87 kg/m   Vitals:   09/20/22 1539  BP: 138/84  Pulse: 73  Height: 6' (1.829 m)  Weight: 286 lb 9.6 oz (130 kg)  SpO2: 94%  BMI (Calculated): 38.86    Physical  Exam Vitals reviewed.  Constitutional:      Appearance: Normal appearance. He is obese.  HENT:     Head: Normocephalic.     Left Ear: There is no impacted cerumen.     Nose: Nose normal.     Mouth/Throat:     Mouth: Mucous membranes are moist.     Pharynx: No posterior oropharyngeal erythema.  Eyes:     Extraocular Movements: Extraocular movements intact.     Pupils: Pupils are equal, round, and reactive to light.  Cardiovascular:     Rate and Rhythm: Regular rhythm.     Chest Wall: PMI is not displaced.     Pulses: Normal pulses.     Heart sounds: Normal heart sounds. No murmur heard.    No S3 sounds.  Pulmonary:     Effort: Pulmonary effort is normal.     Breath sounds: Normal air entry. No rhonchi or rales.  Abdominal:     General: Abdomen is flat. Bowel sounds are normal. There is no distension.  Palpations: Abdomen is soft. There is no hepatomegaly, splenomegaly or mass.     Tenderness: There is no abdominal tenderness.  Musculoskeletal:        General: Normal range of motion.     Cervical back: Normal range of motion and neck supple.     Right lower leg: 2+ Pitting Edema present.     Left lower leg: 2+ Pitting Edema present.  Skin:    General: Skin is warm and dry.  Neurological:     General: No focal deficit present.     Mental Status: He is alert and oriented to person, place, and time.     Cranial Nerves: No cranial nerve deficit.     Motor: No weakness.  Psychiatric:        Mood and Affect: Mood normal.        Behavior: Behavior normal.      No results found for any visits on 09/20/22.  Recent Results (from the past 2160 hour(Helaina Stefano))  Lipid Panel w/o Chol/HDL Ratio     Status: Abnormal   Collection Time: 09/14/22  3:26 PM  Result Value Ref Range   Cholesterol, Total 135 100 - 199 mg/dL   Triglycerides 782 (H) 0 - 149 mg/dL   HDL 43 >95 mg/dL   VLDL Cholesterol Cal 26 5 - 40 mg/dL   LDL Chol Calc (NIH) 66 0 - 99 mg/dL  Hepatic function panel      Status: None   Collection Time: 09/14/22  3:26 PM  Result Value Ref Range   Total Protein 7.1 6.0 - 8.5 g/dL   Albumin 4.7 3.8 - 4.9 g/dL   Bilirubin Total 0.5 0.0 - 1.2 mg/dL   Bilirubin, Direct 6.21 0.00 - 0.40 mg/dL   Alkaline Phosphatase 68 44 - 121 IU/L   AST 26 0 - 40 IU/L   ALT 36 0 - 44 IU/L  Hgb A1c w/o eAG     Status: Abnormal   Collection Time: 09/14/22  3:26 PM  Result Value Ref Range   Hgb A1c MFr Bld 6.3 (H) 4.8 - 5.6 %    Comment:          Prediabetes: 5.7 - 6.4          Diabetes: >6.4          Glycemic control for adults with diabetes: <7.0   CK     Status: None   Collection Time: 09/14/22  3:26 PM  Result Value Ref Range   Total CK 121 41 - 331 U/L      Assessment & Plan:   Problem List Items Addressed This Visit   None   No follow-ups on file.   Total time spent: 30 minutes  Luna Fuse, MD  09/20/2022

## 2022-10-01 ENCOUNTER — Ambulatory Visit (INDEPENDENT_AMBULATORY_CARE_PROVIDER_SITE_OTHER): Payer: Self-pay

## 2022-10-01 DIAGNOSIS — E8770 Fluid overload, unspecified: Secondary | ICD-10-CM

## 2022-10-01 DIAGNOSIS — I340 Nonrheumatic mitral (valve) insufficiency: Secondary | ICD-10-CM

## 2022-10-19 ENCOUNTER — Ambulatory Visit: Payer: No Typology Code available for payment source | Admitting: Internal Medicine

## 2022-10-19 ENCOUNTER — Encounter: Payer: Self-pay | Admitting: Internal Medicine

## 2022-10-19 VITALS — BP 130/90 | HR 75 | Ht 72.0 in | Wt 286.0 lb

## 2022-10-19 DIAGNOSIS — I872 Venous insufficiency (chronic) (peripheral): Secondary | ICD-10-CM | POA: Diagnosis not present

## 2022-10-19 DIAGNOSIS — R600 Localized edema: Secondary | ICD-10-CM

## 2022-10-19 DIAGNOSIS — E782 Mixed hyperlipidemia: Secondary | ICD-10-CM | POA: Diagnosis not present

## 2022-10-19 NOTE — Progress Notes (Signed)
Established Patient Office Visit  Subjective:  Patient ID: Douglas Collier, male    DOB: 05/05/1966  Age: 57 y.o. MRN: [id number]  Chief Complaint  Patient presents with   Follow-up    2 week follow up    Leg swelling has considerably improved and didn't recur. Echo reviewed with patient.    No other concerns at this time.   Past Medical History:  Diagnosis Date   BPH (benign prostatic hyperplasia)     Past Surgical History:  Procedure Laterality Date   Arm surgery Left    radius/ulnar    Social History   Socioeconomic History   Marital status: Married    Spouse name: Not on file   Number of children: Not on file   Years of education: Not on file   Highest education level: Not on file  Occupational History   Not on file  Tobacco Use   Smoking status: Every Day    Packs/day: 1    Types: Cigarettes   Smokeless tobacco: Never  Substance and Sexual Activity   Alcohol use: No    Alcohol/week: 0.0 standard drinks of alcohol   Drug use: No   Sexual activity: Not on file  Other Topics Concern   Not on file  Social History Narrative   Not on file   Social Determinants of Health   Financial Resource Strain: Not on file  Food Insecurity: Not on file  Transportation Needs: Not on file  Physical Activity: Not on file  Stress: Not on file  Social Connections: Not on file  Intimate Partner Violence: Not on file    Family History  Problem Relation Age of Onset   Other Mother        Genetic Disorder also 2 Brothers   Cancer Neg Hx        Kidney Disease    Allergies  Allergen Reactions   Codeine Rash    Review of Systems  All other systems reviewed and are negative.      Objective:   BP (!) 130/90   Pulse 75   Ht 6' (1.829 m)   Wt 286 lb (129.7 kg)   SpO2 90%   BMI 38.79 kg/m   Vitals:   10/19/22 1542  BP: (!) 130/90  Pulse: 75  Height: 6' (1.829 m)  Weight: 286 lb (129.7 kg)  SpO2: 90%  BMI (Calculated): 38.78    Physical  Exam Vitals reviewed.  Constitutional:      Appearance: Normal appearance. He is obese.  HENT:     Head: Normocephalic.     Left Ear: There is no impacted cerumen.     Nose: Nose normal.     Mouth/Throat:     Mouth: Mucous membranes are moist.     Pharynx: No posterior oropharyngeal erythema.  Eyes:     Extraocular Movements: Extraocular movements intact.     Pupils: Pupils are equal, round, and reactive to light.  Cardiovascular:     Rate and Rhythm: Regular rhythm.     Chest Wall: PMI is not displaced.     Pulses: Normal pulses.     Heart sounds: Normal heart sounds. No murmur heard.    No S3 sounds.  Pulmonary:     Effort: Pulmonary effort is normal.     Breath sounds: Normal air entry. No rhonchi or rales.  Abdominal:     General: Abdomen is flat. Bowel sounds are normal. There is no distension.     Palpations: Abdomen  is soft. There is no hepatomegaly, splenomegaly or mass.     Tenderness: There is no abdominal tenderness.  Musculoskeletal:        General: Normal range of motion.     Cervical back: Normal range of motion and neck supple.     Right lower leg: 2+ Pitting Edema present.     Left lower leg: 2+ Pitting Edema present.  Skin:    General: Skin is warm and dry.  Neurological:     General: No focal deficit present.     Mental Status: He is alert and oriented to person, place, and time.     Cranial Nerves: No cranial nerve deficit.     Motor: No weakness.  Psychiatric:        Mood and Affect: Mood normal.        Behavior: Behavior normal.      No results found for any visits on 10/19/22.  Recent Results (from the past 2160 hour(Tanner Yeley))  Lipid Panel w/o Chol/HDL Ratio     Status: Abnormal   Collection Time: 09/14/22  3:26 PM  Result Value Ref Range   Cholesterol, Total 135 100 - 199 mg/dL   Triglycerides 161 (H) 0 - 149 mg/dL   HDL 43 >09 mg/dL   VLDL Cholesterol Cal 26 5 - 40 mg/dL   LDL Chol Calc (NIH) 66 0 - 99 mg/dL  Hepatic function panel      Status: None   Collection Time: 09/14/22  3:26 PM  Result Value Ref Range   Total Protein 7.1 6.0 - 8.5 g/dL   Albumin 4.7 3.8 - 4.9 g/dL   Bilirubin Total 0.5 0.0 - 1.2 mg/dL   Bilirubin, Direct 6.04 0.00 - 0.40 mg/dL   Alkaline Phosphatase 68 44 - 121 IU/L   AST 26 0 - 40 IU/L   ALT 36 0 - 44 IU/L  Hgb A1c w/o eAG     Status: Abnormal   Collection Time: 09/14/22  3:26 PM  Result Value Ref Range   Hgb A1c MFr Bld 6.3 (H) 4.8 - 5.6 %    Comment:          Prediabetes: 5.7 - 6.4          Diabetes: >6.4          Glycemic control for adults with diabetes: <7.0   CK     Status: None   Collection Time: 09/14/22  3:26 PM  Result Value Ref Range   Total CK 121 41 - 331 U/L      Assessment & Plan:   Problem List Items Addressed This Visit       Cardiovascular and Mediastinum   Venous insufficiency of both lower extremities   Relevant Orders   Compression stockings     Other   Mixed hyperlipidemia - Primary   Relevant Orders   Hepatic function panel   CK   Lipid panel   Lipid panel   Other Visit Diagnoses     Localized edema           Return in about 2 months (around 12/19/2022) for fu with labs prior.   Total time spent: 20 minutes  Luna Fuse, MD  10/19/2022   This document may have been prepared by [facility] Voice Recognition software and as such may include unintentional dictation errors.

## 2022-12-12 ENCOUNTER — Other Ambulatory Visit: Payer: Self-pay | Admitting: Nurse Practitioner

## 2022-12-12 DIAGNOSIS — E785 Hyperlipidemia, unspecified: Secondary | ICD-10-CM

## 2022-12-13 ENCOUNTER — Other Ambulatory Visit: Payer: Self-pay | Admitting: Nurse Practitioner

## 2022-12-13 ENCOUNTER — Other Ambulatory Visit: Payer: Self-pay | Admitting: Internal Medicine

## 2022-12-13 DIAGNOSIS — N400 Enlarged prostate without lower urinary tract symptoms: Secondary | ICD-10-CM

## 2022-12-13 DIAGNOSIS — I10 Essential (primary) hypertension: Secondary | ICD-10-CM

## 2022-12-17 ENCOUNTER — Other Ambulatory Visit: Payer: No Typology Code available for payment source

## 2022-12-18 LAB — HEPATIC FUNCTION PANEL
ALT: 23 IU/L (ref 0–44)
AST: 17 IU/L (ref 0–40)
Albumin: 4.5 g/dL (ref 3.8–4.9)
Alkaline Phosphatase: 78 IU/L (ref 44–121)
Bilirubin Total: 0.6 mg/dL (ref 0.0–1.2)
Bilirubin, Direct: 0.19 mg/dL (ref 0.00–0.40)
Total Protein: 6.9 g/dL (ref 6.0–8.5)

## 2022-12-18 LAB — LIPID PANEL
Chol/HDL Ratio: 3 ratio (ref 0.0–5.0)
Cholesterol, Total: 112 mg/dL (ref 100–199)
HDL: 37 mg/dL — ABNORMAL LOW
LDL Chol Calc (NIH): 52 mg/dL (ref 0–99)
Triglycerides: 127 mg/dL (ref 0–149)
VLDL Cholesterol Cal: 23 mg/dL (ref 5–40)

## 2022-12-24 ENCOUNTER — Ambulatory Visit: Payer: No Typology Code available for payment source | Admitting: Internal Medicine

## 2022-12-24 VITALS — BP 140/90 | HR 58 | Ht 72.0 in | Wt 287.4 lb

## 2022-12-24 DIAGNOSIS — R030 Elevated blood-pressure reading, without diagnosis of hypertension: Secondary | ICD-10-CM

## 2022-12-24 DIAGNOSIS — R351 Nocturia: Secondary | ICD-10-CM

## 2022-12-24 DIAGNOSIS — E782 Mixed hyperlipidemia: Secondary | ICD-10-CM | POA: Diagnosis not present

## 2022-12-24 DIAGNOSIS — E119 Type 2 diabetes mellitus without complications: Secondary | ICD-10-CM

## 2022-12-24 DIAGNOSIS — N401 Enlarged prostate with lower urinary tract symptoms: Secondary | ICD-10-CM
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
## 2023-03-10 ENCOUNTER — Other Ambulatory Visit: Payer: Self-pay | Admitting: Internal Medicine

## 2023-03-10 DIAGNOSIS — E785 Hyperlipidemia, unspecified: Secondary | ICD-10-CM

## 2023-03-28 ENCOUNTER — Other Ambulatory Visit: Payer: No Typology Code available for payment source

## 2023-03-28 DIAGNOSIS — N401 Enlarged prostate with lower urinary tract symptoms: Secondary | ICD-10-CM

## 2023-03-28 DIAGNOSIS — R030 Elevated blood-pressure reading, without diagnosis of hypertension: Secondary | ICD-10-CM

## 2023-03-28 DIAGNOSIS — E782 Mixed hyperlipidemia: Secondary | ICD-10-CM

## 2023-03-28 DIAGNOSIS — E119 Type 2 diabetes mellitus without complications: Secondary | ICD-10-CM

## 2023-03-29 LAB — LIPID PANEL
Chol/HDL Ratio: 3.3 ratio (ref 0.0–5.0)
Cholesterol, Total: 124 mg/dL (ref 100–199)
HDL: 38 mg/dL — ABNORMAL LOW (ref >39)
LDL Chol Calc (NIH): 60 mg/dL (ref 0–99)
Triglycerides: 151 mg/dL — ABNORMAL HIGH (ref 0–149)
VLDL Cholesterol Cal: 26 mg/dL (ref 5–40)

## 2023-03-29 LAB — HEMOGLOBIN A1C
Est. average glucose Bld gHb Est-mCnc: 140 mg/dL
Hgb A1c MFr Bld: 6.5 % — ABNORMAL HIGH (ref 4.8–5.6)

## 2023-03-29 LAB — CBC WITH DIFF/PLATELET
Basophils Absolute: 0 10*3/uL (ref 0.0–0.2)
Basos: 0 %
EOS (ABSOLUTE): 0.1 10*3/uL (ref 0.0–0.4)
Eos: 2 %
Hematocrit: 46.9 % (ref 37.5–51.0)
Hemoglobin: 16.1 g/dL (ref 13.0–17.7)
Immature Grans (Abs): 0 10*3/uL (ref 0.0–0.1)
Immature Granulocytes: 0 %
Lymphocytes Absolute: 2.1 10*3/uL (ref 0.7–3.1)
Lymphs: 27 %
MCH: 31 pg (ref 26.6–33.0)
MCHC: 34.3 g/dL (ref 31.5–35.7)
MCV: 90 fL (ref 79–97)
Monocytes Absolute: 0.7 10*3/uL (ref 0.1–0.9)
Monocytes: 9 %
Neutrophils Absolute: 4.8 10*3/uL (ref 1.4–7.0)
Neutrophils: 62 %
Platelets: 330 10*3/uL (ref 150–450)
RBC: 5.19 x10E6/uL (ref 4.14–5.80)
RDW: 12.4 % (ref 11.6–15.4)
WBC: 7.8 10*3/uL (ref 3.4–10.8)

## 2023-03-29 LAB — COMPREHENSIVE METABOLIC PANEL
ALT: 24 [IU]/L (ref 0–44)
AST: 15 [IU]/L (ref 0–40)
Albumin: 4.5 g/dL (ref 3.8–4.9)
Alkaline Phosphatase: 86 [IU]/L (ref 44–121)
BUN/Creatinine Ratio: 11 (ref 9–20)
BUN: 10 mg/dL (ref 6–24)
Bilirubin Total: 0.6 mg/dL (ref 0.0–1.2)
CO2: 21 mmol/L (ref 20–29)
Calcium: 9.1 mg/dL (ref 8.7–10.2)
Chloride: 102 mmol/L (ref 96–106)
Creatinine, Ser: 0.95 mg/dL (ref 0.76–1.27)
Globulin, Total: 2.5 g/dL (ref 1.5–4.5)
Glucose: 83 mg/dL (ref 70–99)
Potassium: 4.1 mmol/L (ref 3.5–5.2)
Sodium: 138 mmol/L (ref 134–144)
Total Protein: 7 g/dL (ref 6.0–8.5)
eGFR: 92 mL/min/{1.73_m2} (ref >59)

## 2023-03-29 LAB — CK: Total CK: 90 U/L (ref 41–331)

## 2023-03-29 LAB — PSA: Prostate Specific Ag, Serum: 0.9 ng/mL (ref 0.0–4.0)

## 2023-04-01 ENCOUNTER — Ambulatory Visit (INDEPENDENT_AMBULATORY_CARE_PROVIDER_SITE_OTHER): Payer: No Typology Code available for payment source | Admitting: Internal Medicine

## 2023-04-01 ENCOUNTER — Encounter: Payer: Self-pay | Admitting: Internal Medicine

## 2023-04-01 VITALS — BP 120/82 | HR 76 | Ht 72.0 in | Wt 286.4 lb

## 2023-04-01 DIAGNOSIS — E119 Type 2 diabetes mellitus without complications: Secondary | ICD-10-CM

## 2023-04-01 DIAGNOSIS — Z1331 Encounter for screening for depression: Secondary | ICD-10-CM

## 2023-04-01 DIAGNOSIS — Z6838 Body mass index (BMI) 38.0-38.9, adult: Secondary | ICD-10-CM

## 2023-04-01 DIAGNOSIS — B351 Tinea unguium: Secondary | ICD-10-CM

## 2023-04-01 DIAGNOSIS — N401 Enlarged prostate with lower urinary tract symptoms: Secondary | ICD-10-CM | POA: Diagnosis not present

## 2023-04-01 DIAGNOSIS — Z23 Encounter for immunization: Secondary | ICD-10-CM | POA: Diagnosis not present

## 2023-04-01 DIAGNOSIS — R351 Nocturia: Secondary | ICD-10-CM

## 2023-04-01 DIAGNOSIS — E782 Mixed hyperlipidemia: Secondary | ICD-10-CM | POA: Diagnosis not present

## 2023-04-01 DIAGNOSIS — E66812 Obesity, class 2: Secondary | ICD-10-CM

## 2023-04-01 DIAGNOSIS — Z0130 Encounter for examination of blood pressure without abnormal findings: Secondary | ICD-10-CM

## 2023-04-01 DIAGNOSIS — J301 Allergic rhinitis due to pollen: Secondary | ICD-10-CM

## 2023-04-01 DIAGNOSIS — E6609 Other obesity due to excess calories: Secondary | ICD-10-CM

## 2023-04-01 MED ORDER — FLUTICASONE PROPIONATE 50 MCG/ACT NA SUSP: 1.0000 | Freq: Every day | NASAL | 2 refills | Status: DC

## 2023-04-01 MED ORDER — TERBINAFINE HCL 250 MG PO TABS: 250.0000 mg | ORAL_TABLET | Freq: Every day | ORAL | 0 refills | Status: DC

## 2023-04-01 MED ORDER — CETIRIZINE HCL 10 MG PO TABS: 10.0000 mg | ORAL_TABLET | Freq: Every day | ORAL | 2 refills | Status: DC

## 2023-04-01 NOTE — Progress Notes (Signed)
Established Patient Office Visit  Subjective:  Patient ID: Douglas Collier, male    DOB: April 27, 1966  Age: 57 y.o. MRN: [id number]  Chief Complaint  Patient presents with   Annual Exam    3 month CPE with lab results    No new complaints, here for CPE, lab review and medication refills. Labs reviewed and notable for  No new complaints, here for lab review and medication refills. Labs reviewed and notable for well controlled diabetes, A1c at target, lipids at target with unremarkable cmp although trigs have deteriorated. Denies any hypoglycemic episodes and home bg readings have been at target.    No other concerns at this time.   Past Medical History:  Diagnosis Date   BPH (benign prostatic hyperplasia)     Past Surgical History:  Procedure Laterality Date   Arm surgery Left    radius/ulnar    Social History   Socioeconomic History   Marital status: Married    Spouse name: Not on file   Number of children: Not on file   Years of education: Not on file   Highest education level: Not on file  Occupational History   Not on file  Tobacco Use   Smoking status: Former    Types: Cigarettes   Smokeless tobacco: Never  Substance and Sexual Activity   Alcohol use: No    Alcohol/week: 0.0 standard drinks of alcohol   Drug use: No   Sexual activity: Yes  Other Topics Concern   Not on file  Social History Narrative   Not on file   Social Determinants of Health   Financial Resource Strain: Not on file  Food Insecurity: Not on file  Transportation Needs: Not on file  Physical Activity: Not on file  Stress: Not on file  Social Connections: Not on file  Intimate Partner Violence: Not on file    Family History  Problem Relation Age of Onset   Other Mother        Genetic Disorder also 2 Brothers   Cancer Neg Hx        Kidney Disease    Allergies  Allergen Reactions   Codeine Rash    Review of Systems  Constitutional:  Positive for weight loss.  HENT:  Negative.    Eyes: Negative.   Respiratory: Negative.    Cardiovascular: Negative.   Gastrointestinal: Negative.   Genitourinary: Negative.   Musculoskeletal:  Positive for joint pain.  Skin: Negative.   Neurological: Negative.   Endo/Heme/Allergies: Negative.   Psychiatric/Behavioral: Negative.         Objective:   BP 120/82   Pulse 76   Ht 6' (1.829 m)   Wt 286 lb 6.4 oz (129.9 kg)   SpO2 90%   BMI 38.84 kg/m   Vitals:   04/01/23 1455  BP: 120/82  Pulse: 76  Height: 6' (1.829 m)  Weight: 286 lb 6.4 oz (129.9 kg)  SpO2: 90%  BMI (Calculated): 38.83    Physical Exam Vitals reviewed.  Constitutional:      General: He is not in acute distress.    Appearance: Normal appearance. He is obese.  HENT:     Head: Normocephalic.     Left Ear: There is no impacted cerumen.     Nose: Nose normal.     Mouth/Throat:     Mouth: Mucous membranes are moist.  Eyes:     Extraocular Movements: Extraocular movements intact.     Pupils: Pupils are equal, round, and reactive  to light.  Cardiovascular:     Rate and Rhythm: Regular rhythm.     Chest Wall: PMI is not displaced.     Pulses: Normal pulses.     Heart sounds: Normal heart sounds. No murmur heard.    No S3 sounds.  Pulmonary:     Effort: Pulmonary effort is normal.     Breath sounds: Normal air entry. No rhonchi or rales.  Abdominal:     General: Abdomen is flat. Bowel sounds are normal. There is no distension.     Palpations: Abdomen is soft. There is no hepatomegaly, splenomegaly or mass.     Tenderness: There is no abdominal tenderness.  Musculoskeletal:        General: Normal range of motion.     Cervical back: Normal range of motion and neck supple.     Right lower leg: 1+ Pitting Edema present.     Left lower leg: 1+ Pitting Edema present.  Skin:    General: Skin is warm and dry.  Neurological:     General: No focal deficit present.     Mental Status: He is alert and oriented to person, place, and time.      Cranial Nerves: No cranial nerve deficit.     Motor: No weakness.  Psychiatric:        Mood and Affect: Mood normal.        Behavior: Behavior normal.      No results found for any visits on 04/01/23.  Recent Results (from the past 2160 hour(Tyneshia Stivers))  PSA     Status: None   Collection Time: 03/28/23  2:52 PM  Result Value Ref Range   Prostate Specific Ag, Serum 0.9 0.0 - 4.0 ng/mL    Comment: Roche ECLIA methodology. According to the American Urological Association, Serum PSA should decrease and remain at undetectable levels after radical prostatectomy. The AUA defines biochemical recurrence as an initial PSA value 0.2 ng/mL or greater followed by a subsequent confirmatory PSA value 0.2 ng/mL or greater. Values obtained with different assay methods or kits cannot be used interchangeably. Results cannot be interpreted as absolute evidence of the presence or absence of malignant disease.   Hemoglobin A1c     Status: Abnormal   Collection Time: 03/28/23  2:55 PM  Result Value Ref Range   Hgb A1c MFr Bld 6.5 (H) 4.8 - 5.6 %    Comment:          Prediabetes: 5.7 - 6.4          Diabetes: >6.4          Glycemic control for adults with diabetes: <7.0    Est. average glucose Bld gHb Est-mCnc 140 mg/dL  CK     Status: None   Collection Time: 03/28/23  2:55 PM  Result Value Ref Range   Total CK 90 41 - 331 U/L  Comprehensive metabolic panel     Status: None   Collection Time: 03/28/23  2:55 PM  Result Value Ref Range   Glucose 83 70 - 99 mg/dL   BUN 10 6 - 24 mg/dL   Creatinine, Ser 7.84 0.76 - 1.27 mg/dL   eGFR 92 >69 GE/XBM/8.41   BUN/Creatinine Ratio 11 9 - 20   Sodium 138 134 - 144 mmol/L   Potassium 4.1 3.5 - 5.2 mmol/L   Chloride 102 96 - 106 mmol/L   CO2 21 20 - 29 mmol/L   Calcium 9.1 8.7 - 10.2 mg/dL   Total Protein  7.0 6.0 - 8.5 g/dL   Albumin 4.5 3.8 - 4.9 g/dL   Globulin, Total 2.5 1.5 - 4.5 g/dL   Bilirubin Total 0.6 0.0 - 1.2 mg/dL   Alkaline Phosphatase 86  44 - 121 IU/L   AST 15 0 - 40 IU/L   ALT 24 0 - 44 IU/L  CBC With Diff/Platelet     Status: None   Collection Time: 03/28/23  2:55 PM  Result Value Ref Range   WBC 7.8 3.4 - 10.8 x10E3/uL   RBC 5.19 4.14 - 5.80 x10E6/uL   Hemoglobin 16.1 13.0 - 17.7 g/dL   Hematocrit 40.9 81.1 - 51.0 %   MCV 90 79 - 97 fL   MCH 31.0 26.6 - 33.0 pg   MCHC 34.3 31.5 - 35.7 g/dL   RDW 91.4 78.2 - 95.6 %   Platelets 330 150 - 450 x10E3/uL   Neutrophils 62 Not Estab. %   Lymphs 27 Not Estab. %   Monocytes 9 Not Estab. %   Eos 2 Not Estab. %   Basos 0 Not Estab. %   Neutrophils Absolute 4.8 1.4 - 7.0 x10E3/uL   Lymphocytes Absolute 2.1 0.7 - 3.1 x10E3/uL   Monocytes Absolute 0.7 0.1 - 0.9 x10E3/uL   EOS (ABSOLUTE) 0.1 0.0 - 0.4 x10E3/uL   Basophils Absolute 0.0 0.0 - 0.2 x10E3/uL   Immature Granulocytes 0 Not Estab. %   Immature Grans (Abs) 0.0 0.0 - 0.1 x10E3/uL  Lipid panel     Status: Abnormal   Collection Time: 03/28/23  2:55 PM  Result Value Ref Range   Cholesterol, Total 124 100 - 199 mg/dL   Triglycerides 213 (H) 0 - 149 mg/dL   HDL 38 (L) >08 mg/dL   VLDL Cholesterol Cal 26 5 - 40 mg/dL   LDL Chol Calc (NIH) 60 0 - 99 mg/dL   Chol/HDL Ratio 3.3 0.0 - 5.0 ratio    Comment:                                   T. Chol/HDL Ratio                                             Men  Women                               1/2 Avg.Risk  3.4    3.3                                   Avg.Risk  5.0    4.4                                2X Avg.Risk  9.6    7.1                                3X Avg.Risk 23.4   11.0       Assessment & Plan:  As per problem list  Problem List Items Addressed This Visit       Respiratory  Seasonal allergic rhinitis due to pollen   Relevant Medications   fluticasone (FLONASE) 50 MCG/ACT nasal spray   cetirizine (ZYRTEC ALLERGY) 10 MG tablet     Endocrine   Controlled type 2 diabetes mellitus without complication, without long-term current use of insulin (HCC) -  Primary   Relevant Orders   POC CREATINE & ALBUMIN,URINE     Other   Benign prostatic hyperplasia with nocturia   Mixed hyperlipidemia   Class 2 obesity due to excess calories without serious comorbidity with body mass index (BMI) of 38.0 to 38.9 in adult   Other Visit Diagnoses     Onychomycosis       Relevant Medications   terbinafine (LAMISIL) 250 MG tablet       Return in about 6 weeks (around 05/13/2023) for fu with labs prior.   Total time spent: 45 minutes  Luna Fuse, MD  04/01/2023   This document may have been prepared by [facility] Voice Recognition software and as such may include unintentional dictation errors.

## 2023-04-12 ENCOUNTER — Telehealth: Payer: Self-pay | Admitting: Internal Medicine

## 2023-04-12 NOTE — Telephone Encounter (Signed)
Patient's wife left VM stating patient needs some antibiotics for his ears. Need to call and get more info and see what is wrong with his ears.

## 2023-05-05 ENCOUNTER — Other Ambulatory Visit: Payer: Self-pay | Admitting: Internal Medicine

## 2023-05-05 DIAGNOSIS — N400 Enlarged prostate without lower urinary tract symptoms: Secondary | ICD-10-CM

## 2023-05-20 ENCOUNTER — Ambulatory Visit: Payer: No Typology Code available for payment source | Admitting: Internal Medicine

## 2023-05-30 ENCOUNTER — Ambulatory Visit: Payer: No Typology Code available for payment source | Admitting: Internal Medicine

## 2023-06-02 ENCOUNTER — Other Ambulatory Visit: Payer: No Typology Code available for payment source

## 2023-06-02 DIAGNOSIS — E782 Mixed hyperlipidemia: Secondary | ICD-10-CM

## 2023-06-03 LAB — LIPID PANEL
Chol/HDL Ratio: 3.3 {ratio} (ref 0.0–5.0)
Cholesterol, Total: 121 mg/dL (ref 100–199)
HDL: 37 mg/dL — ABNORMAL LOW (ref >39)
LDL Chol Calc (NIH): 57 mg/dL (ref 0–99)
Triglycerides: 160 mg/dL — ABNORMAL HIGH (ref 0–149)
VLDL Cholesterol Cal: 27 mg/dL (ref 5–40)

## 2023-06-07 ENCOUNTER — Ambulatory Visit: Payer: No Typology Code available for payment source | Admitting: Internal Medicine

## 2023-06-07 VITALS — BP 123/81 | HR 79 | Temp 98.1°F | Ht 72.0 in | Wt 287.2 lb

## 2023-06-07 DIAGNOSIS — Z0130 Encounter for examination of blood pressure without abnormal findings: Secondary | ICD-10-CM

## 2023-06-07 DIAGNOSIS — E119 Type 2 diabetes mellitus without complications: Secondary | ICD-10-CM | POA: Diagnosis not present

## 2023-06-07 DIAGNOSIS — B351 Tinea unguium: Secondary | ICD-10-CM | POA: Diagnosis not present

## 2023-06-07 LAB — POCT CBG (FASTING - GLUCOSE)-MANUAL ENTRY: Glucose Fasting, POC: 117 mg/dL — AB (ref 70–99)

## 2023-06-07 NOTE — Progress Notes (Signed)
 Established Patient Office Visit  Subjective:  Patient ID: Douglas Collier, male    DOB: 27-May-1965  Age: 58 y.o. MRN: [id number]  No chief complaint on file.   No new complaints, here for lab review and medication refills.     No other concerns at this time.   Past Medical History:  Diagnosis Date   BPH (benign prostatic hyperplasia)     Past Surgical History:  Procedure Laterality Date   Arm surgery Left    radius/ulnar    Social History   Socioeconomic History   Marital status: Married    Spouse name: Not on file   Number of children: Not on file   Years of education: Not on file   Highest education level: Not on file  Occupational History   Not on file  Tobacco Use   Smoking status: Former    Types: Cigarettes   Smokeless tobacco: Never  Substance and Sexual Activity   Alcohol use: No    Alcohol/week: 0.0 standard drinks of alcohol   Drug use: No   Sexual activity: Yes  Other Topics Concern   Not on file  Social History Narrative   Not on file   Social Drivers of Health   Financial Resource Strain: Not on file  Food Insecurity: Not on file  Transportation Needs: Not on file  Physical Activity: Not on file  Stress: Not on file  Social Connections: Not on file  Intimate Partner Violence: Not on file    Family History  Problem Relation Age of Onset   Other Mother        Genetic Disorder also 2 Brothers   Cancer Neg Hx        Kidney Disease    Allergies  Allergen Reactions   Codeine Rash    Outpatient Medications Prior to Visit  Medication Sig   atorvastatin (LIPITOR) 10 MG tablet TAKE 1 TABLET BY MOUTH EVERY DAY IN THE EVENING   cetirizine  (ZYRTEC  ALLERGY) 10 MG tablet Take 1 tablet (10 mg total) by mouth daily.   finasteride  (PROSCAR ) 5 MG tablet TAKE 1 TABLET BY MOUTH EVERY DAY IN THE MORNING   fluticasone  (FLONASE ) 50 MCG/ACT nasal spray Place 1 spray into both nostrils daily.   nebivolol (BYSTOLIC) 5 MG tablet TAKE 1 TABLET BY MOUTH  EVERY DAY   tamsulosin  (FLOMAX ) 0.4 MG CAPS capsule TAKE 1 CAPSULE BY MOUTH EVERY DAY IN THE MORNING   No facility-administered medications prior to visit.    ROS     Objective:   BP 123/81   Pulse 79   Temp 98.1 F (36.7 C)   Ht 6' (1.829 m)   Wt 287 lb 3.2 oz (130.3 kg)   SpO2 93%   BMI 38.95 kg/m   Vitals:   06/07/23 1043  BP: 123/81  Pulse: 79  Temp: 98.1 F (36.7 C)  Height: 6' (1.829 m)  Weight: 287 lb 3.2 oz (130.3 kg)  SpO2: 93%  BMI (Calculated): 38.94    Physical Exam Vitals reviewed.  Constitutional:      General: He is not in acute distress.    Appearance: Normal appearance. He is obese.  HENT:     Head: Normocephalic.     Left Ear: There is no impacted cerumen.     Nose: Nose normal.     Mouth/Throat:     Mouth: Mucous membranes are moist.  Eyes:     Extraocular Movements: Extraocular movements intact.     Pupils: Pupils  are equal, round, and reactive to light.  Cardiovascular:     Rate and Rhythm: Regular rhythm.     Chest Wall: PMI is not displaced.     Pulses: Normal pulses.     Heart sounds: Normal heart sounds. No murmur heard.    No S3 sounds.  Pulmonary:     Effort: Pulmonary effort is normal.     Breath sounds: Normal air entry. No rhonchi or rales.  Abdominal:     General: Abdomen is flat. Bowel sounds are normal. There is no distension.     Palpations: Abdomen is soft. There is no hepatomegaly, splenomegaly or mass.     Tenderness: There is no abdominal tenderness.  Musculoskeletal:        General: Normal range of motion.     Cervical back: Normal range of motion and neck supple.     Right lower leg: 1+ Pitting Edema present.     Left lower leg: 1+ Pitting Edema present.  Skin:    General: Skin is warm and dry.  Neurological:     General: No focal deficit present.     Mental Status: He is alert and oriented to person, place, and time.     Cranial Nerves: No cranial nerve deficit.     Motor: No weakness.  Psychiatric:         Mood and Affect: Mood normal.        Behavior: Behavior normal.      Results for orders placed or performed in visit on 06/07/23  POCT CBG (Fasting - Glucose)  Result Value Ref Range   Glucose Fasting, POC 117 (A) 70 - 99 mg/dL        Assessment & Plan:  As per problem list. Will refill lamisil  once lft results are available.  Problem List Items Addressed This Visit       Endocrine   Controlled type 2 diabetes mellitus without complication, without long-term current use of insulin (HCC) - Primary   Relevant Orders   POCT CBG (Fasting - Glucose) (Completed)   Other Visit Diagnoses       Onychomycosis       Relevant Orders   Hepatic function panel       Return in about 4 weeks (around 07/05/2023). Will order A1c on same day.  Total time spent: 20 minutes  Sherrill Cinderella Perry, MD  06/07/2023   This document may have been prepared by [facility] Voice Recognition software and as such may include unintentional dictation errors.

## 2023-06-10 ENCOUNTER — Other Ambulatory Visit: Payer: Self-pay | Admitting: Internal Medicine

## 2023-06-10 DIAGNOSIS — E785 Hyperlipidemia, unspecified: Secondary | ICD-10-CM

## 2023-06-26 ENCOUNTER — Other Ambulatory Visit: Payer: Self-pay | Admitting: Internal Medicine

## 2023-06-26 DIAGNOSIS — J301 Allergic rhinitis due to pollen: Secondary | ICD-10-CM

## 2023-07-01 ENCOUNTER — Other Ambulatory Visit: Payer: No Typology Code available for payment source

## 2023-07-02 LAB — HEPATIC FUNCTION PANEL
ALT: 23 [IU]/L (ref 0–44)
AST: 16 [IU]/L (ref 0–40)
Albumin: 4.4 g/dL (ref 3.8–4.9)
Alkaline Phosphatase: 83 [IU]/L (ref 44–121)
Bilirubin Total: 0.7 mg/dL (ref 0.0–1.2)
Bilirubin, Direct: 0.25 mg/dL (ref 0.00–0.40)
Total Protein: 7.1 g/dL (ref 6.0–8.5)

## 2023-07-08 ENCOUNTER — Encounter: Payer: Self-pay | Admitting: Internal Medicine

## 2023-07-08 ENCOUNTER — Ambulatory Visit: Payer: No Typology Code available for payment source | Admitting: Internal Medicine

## 2023-07-08 VITALS — BP 140/79 | HR 70 | Ht 72.0 in | Wt 289.0 lb

## 2023-07-08 DIAGNOSIS — E782 Mixed hyperlipidemia: Secondary | ICD-10-CM

## 2023-07-08 DIAGNOSIS — E119 Type 2 diabetes mellitus without complications: Secondary | ICD-10-CM | POA: Diagnosis not present

## 2023-07-08 DIAGNOSIS — B351 Tinea unguium: Secondary | ICD-10-CM | POA: Diagnosis not present

## 2023-07-08 DIAGNOSIS — I10 Essential (primary) hypertension: Secondary | ICD-10-CM

## 2023-07-08 MED ORDER — TERBINAFINE HCL 250 MG PO TABS: 250.0000 mg | ORAL_TABLET | Freq: Every day | ORAL | 0 refills | Status: DC

## 2023-07-08 NOTE — Progress Notes (Signed)
Established Patient Office Visit  Subjective:  Patient ID: Douglas Collier, male    DOB: 1965/10/27  Age: 58 y.o. MRN: [id number]  No chief complaint on file.   No new complaints, here for lab review and medication refills. Only labs available are liver function tests which are unremarkable.    No other concerns at this time.   Past Medical History:  Diagnosis Date   BPH (benign prostatic hyperplasia)     Past Surgical History:  Procedure Laterality Date   Arm surgery Left    radius/ulnar    Social History   Socioeconomic History   Marital status: Married    Spouse name: Not on file   Number of children: Not on file   Years of education: Not on file   Highest education level: Not on file  Occupational History   Not on file  Tobacco Use   Smoking status: Former    Types: Cigarettes   Smokeless tobacco: Never  Substance and Sexual Activity   Alcohol use: No    Alcohol/week: 0.0 standard drinks of alcohol   Drug use: No   Sexual activity: Yes  Other Topics Concern   Not on file  Social History Narrative   Not on file   Social Drivers of Health   Financial Resource Strain: Not on file  Food Insecurity: Not on file  Transportation Needs: Not on file  Physical Activity: Not on file  Stress: Not on file  Social Connections: Not on file  Intimate Partner Violence: Not on file    Family History  Problem Relation Age of Onset   Other Mother        Genetic Disorder also 2 Brothers   Cancer Neg Hx        Kidney Disease    Allergies  Allergen Reactions   Codeine Rash    Outpatient Medications Prior to Visit  Medication Sig   atorvastatin (LIPITOR) 10 MG tablet TAKE 1 TABLET BY MOUTH EVERY DAY IN THE EVENING   cetirizine (ZYRTEC) 10 MG tablet TAKE 1 TABLET BY MOUTH EVERY DAY   finasteride (PROSCAR) 5 MG tablet TAKE 1 TABLET BY MOUTH EVERY DAY IN THE MORNING   fluticasone (FLONASE) 50 MCG/ACT nasal spray Place 1 spray into both nostrils daily.    nebivolol (BYSTOLIC) 5 MG tablet TAKE 1 TABLET BY MOUTH EVERY DAY   tamsulosin (FLOMAX) 0.4 MG CAPS capsule TAKE 1 CAPSULE BY MOUTH EVERY DAY IN THE MORNING   No facility-administered medications prior to visit.    Review of Systems  Constitutional:  Positive for weight loss.  HENT: Negative.    Eyes: Negative.   Respiratory: Negative.    Cardiovascular: Negative.   Gastrointestinal: Negative.   Genitourinary: Negative.   Musculoskeletal:  Positive for joint pain.  Skin: Negative.   Neurological: Negative.   Endo/Heme/Allergies: Negative.   Psychiatric/Behavioral: Negative.         Objective:   BP (!) 140/79   Pulse 70   Ht 6' (1.829 m)   Wt 289 lb (131.1 kg)   SpO2 94%   BMI 39.20 kg/m   Vitals:   07/08/23 1412  BP: (!) 140/79  Pulse: 70  Height: 6' (1.829 m)  Weight: 289 lb (131.1 kg)  SpO2: 94%  BMI (Calculated): 39.19    Physical Exam Vitals reviewed.  Constitutional:      General: He is not in acute distress.    Appearance: Normal appearance. He is obese.  HENT:  Head: Normocephalic.     Left Ear: There is no impacted cerumen.     Nose: Nose normal.     Mouth/Throat:     Mouth: Mucous membranes are moist.  Eyes:     Extraocular Movements: Extraocular movements intact.     Pupils: Pupils are equal, round, and reactive to light.  Cardiovascular:     Rate and Rhythm: Regular rhythm.     Chest Wall: PMI is not displaced.     Pulses: Normal pulses.     Heart sounds: Normal heart sounds. No murmur heard.    No S3 sounds.  Pulmonary:     Effort: Pulmonary effort is normal.     Breath sounds: Normal air entry. No rhonchi or rales.  Abdominal:     General: Abdomen is flat. Bowel sounds are normal. There is no distension.     Palpations: Abdomen is soft. There is no hepatomegaly, splenomegaly or mass.     Tenderness: There is no abdominal tenderness.  Musculoskeletal:        General: Normal range of motion.     Cervical back: Normal range of  motion and neck supple.     Right lower leg: 1+ Pitting Edema present.     Left lower leg: 1+ Pitting Edema present.  Skin:    General: Skin is warm and dry.  Neurological:     General: No focal deficit present.     Mental Status: He is alert and oriented to person, place, and time.     Cranial Nerves: No cranial nerve deficit.     Motor: No weakness.  Psychiatric:        Mood and Affect: Mood normal.        Behavior: Behavior normal.      No results found for any visits on 07/08/23.  Recent Results (from the past 2160 hours)  Lipid panel     Status: Abnormal   Collection Time: 06/02/23  9:53 AM  Result Value Ref Range   Cholesterol, Total 121 100 - 199 mg/dL   Triglycerides 962 (H) 0 - 149 mg/dL   HDL 37 (L) >95 mg/dL   VLDL Cholesterol Cal 27 5 - 40 mg/dL   LDL Chol Calc (NIH) 57 0 - 99 mg/dL   Chol/HDL Ratio 3.3 0.0 - 5.0 ratio    Comment:                                   T. Chol/HDL Ratio                                             Men  Women                               1/2 Avg.Risk  3.4    3.3                                   Avg.Risk  5.0    4.4                                2X Avg.Risk  9.6  7.1                                3X Avg.Risk 23.4   11.0   POCT CBG (Fasting - Glucose)     Status: Abnormal   Collection Time: 06/07/23 10:45 AM  Result Value Ref Range   Glucose Fasting, POC 117 (A) 70 - 99 mg/dL  Hepatic function panel     Status: None   Collection Time: 07/01/23  3:12 PM  Result Value Ref Range   Total Protein 7.1 6.0 - 8.5 g/dL   Albumin 4.4 3.8 - 4.9 g/dL   Bilirubin Total 0.7 0.0 - 1.2 mg/dL   Bilirubin, Direct 0.98 0.00 - 0.40 mg/dL   Alkaline Phosphatase 83 44 - 121 IU/L   AST 16 0 - 40 IU/L   ALT 23 0 - 44 IU/L      Assessment & Plan:   Problem List Items Addressed This Visit       Endocrine   Controlled type 2 diabetes mellitus without complication, without long-term current use of insulin (HCC) - Primary   Relevant Orders    POCT CBG (Fasting - Glucose)     Other   Mixed hyperlipidemia   Other Visit Diagnoses       Onychomycosis       Relevant Medications   terbinafine (LAMISIL) 250 MG tablet       Return in about 3 months (around 10/05/2023) for fu with labs prior.   Total time spent: 20 minutes  Luna Fuse, MD  07/08/2023   This document may have been prepared by [facility] Voice Recognition software and as such may include unintentional dictation errors.

## 2023-09-13 ENCOUNTER — Other Ambulatory Visit: Payer: Self-pay | Admitting: Internal Medicine

## 2023-09-13 DIAGNOSIS — I10 Essential (primary) hypertension: Secondary | ICD-10-CM

## 2023-10-03 ENCOUNTER — Other Ambulatory Visit

## 2023-10-03 DIAGNOSIS — E782 Mixed hyperlipidemia: Secondary | ICD-10-CM

## 2023-10-04 LAB — LIPID PANEL
Chol/HDL Ratio: 3.3 ratio (ref 0.0–5.0)
Cholesterol, Total: 130 mg/dL (ref 100–199)
HDL: 39 mg/dL — ABNORMAL LOW (ref >39)
LDL Chol Calc (NIH): 63 mg/dL (ref 0–99)
Triglycerides: 161 mg/dL — ABNORMAL HIGH (ref 0–149)
VLDL Cholesterol Cal: 28 mg/dL (ref 5–40)

## 2023-10-12 ENCOUNTER — Ambulatory Visit: Admitting: Internal Medicine

## 2023-10-12 ENCOUNTER — Encounter: Payer: Self-pay | Admitting: Internal Medicine

## 2023-10-12 ENCOUNTER — Ambulatory Visit: Payer: Self-pay | Admitting: Internal Medicine

## 2023-10-12 VITALS — BP 122/80 | HR 77 | Temp 97.1°F | Ht 72.0 in | Wt 294.4 lb

## 2023-10-12 DIAGNOSIS — E782 Mixed hyperlipidemia: Secondary | ICD-10-CM | POA: Diagnosis not present

## 2023-10-12 DIAGNOSIS — E119 Type 2 diabetes mellitus without complications: Secondary | ICD-10-CM

## 2023-10-12 DIAGNOSIS — N400 Enlarged prostate without lower urinary tract symptoms: Secondary | ICD-10-CM

## 2023-10-12 DIAGNOSIS — J301 Allergic rhinitis due to pollen: Secondary | ICD-10-CM | POA: Diagnosis not present

## 2023-10-12 DIAGNOSIS — Z0130 Encounter for examination of blood pressure without abnormal findings: Secondary | ICD-10-CM

## 2023-10-12 MED ORDER — FINASTERIDE 5 MG PO TABS: ORAL_TABLET | ORAL | 1 refills | Status: DC

## 2023-10-12 MED ORDER — FLUTICASONE PROPIONATE 50 MCG/ACT NA SUSP: 1.0000 | Freq: Every day | NASAL | 2 refills | Status: DC

## 2023-10-12 NOTE — Progress Notes (Signed)
 Established Patient Office Visit  Subjective:  Patient ID: Douglas Collier, male    DOB: 11/21/1965  Age: 58 y.o. MRN: [id number]  Chief Complaint  Patient presents with   Follow-up    3 month lab results    No new complaints, here for lab review and medication refills. LDL and TC well controlled on lab review. Triglycerides stable but still slightly elevated.      No other concerns at this time.   Past Medical History:  Diagnosis Date   BPH (benign prostatic hyperplasia)     Past Surgical History:  Procedure Laterality Date   Arm surgery Left    radius/ulnar    Social History   Socioeconomic History   Marital status: Married    Spouse name: Not on file   Number of children: Not on file   Years of education: Not on file   Highest education level: Not on file  Occupational History   Not on file  Tobacco Use   Smoking status: Former    Types: Cigarettes   Smokeless tobacco: Never  Substance and Sexual Activity   Alcohol use: No    Alcohol/week: 0.0 standard drinks of alcohol   Drug use: No   Sexual activity: Yes  Other Topics Concern   Not on file  Social History Narrative   Not on file   Social Drivers of Health   Financial Resource Strain: Not on file  Food Insecurity: Not on file  Transportation Needs: Not on file  Physical Activity: Not on file  Stress: Not on file  Social Connections: Not on file  Intimate Partner Violence: Not on file    Family History  Problem Relation Age of Onset   Other Mother        Genetic Disorder also 2 Brothers   Cancer Neg Hx        Kidney Disease    Allergies  Allergen Reactions   Codeine Rash    Outpatient Medications Prior to Visit  Medication Sig Note   atorvastatin (LIPITOR) 10 MG tablet TAKE 1 TABLET BY MOUTH EVERY DAY IN THE EVENING    cetirizine  (ZYRTEC ) 10 MG tablet TAKE 1 TABLET BY MOUTH EVERY DAY    nebivolol (BYSTOLIC) 5 MG tablet TAKE 1 TABLET BY MOUTH EVERY DAY    tamsulosin  (FLOMAX ) 0.4 MG  CAPS capsule TAKE 1 CAPSULE BY MOUTH EVERY DAY IN THE MORNING    [DISCONTINUED] finasteride  (PROSCAR ) 5 MG tablet TAKE 1 TABLET BY MOUTH EVERY DAY IN THE MORNING    [DISCONTINUED] fluticasone  (FLONASE ) 50 MCG/ACT nasal spray Place 1 spray into both nostrils daily. 10/12/2023: As needed   No facility-administered medications prior to visit.    Review of Systems  Constitutional:  Negative for weight loss (4 lbs).  HENT: Negative.    Eyes: Negative.   Respiratory: Negative.    Cardiovascular: Negative.   Gastrointestinal: Negative.   Genitourinary: Negative.   Musculoskeletal:  Positive for joint pain.  Skin: Negative.   Neurological: Negative.   Endo/Heme/Allergies: Negative.   Psychiatric/Behavioral: Negative.         Objective:   BP 122/80   Pulse 77   Temp (!) 97.1 F (36.2 C)   Ht 6' (1.829 m)   Wt 294 lb 6.4 oz (133.5 kg)   SpO2 93%   BMI 39.93 kg/m   Vitals:   10/12/23 1133  BP: 122/80  Pulse: 77  Temp: (!) 97.1 F (36.2 C)  Height: 6' (1.829 m)  Weight:  294 lb 6.4 oz (133.5 kg)  SpO2: 93%  BMI (Calculated): 39.92    Physical Exam   No results found for any visits on 10/12/23.  Recent Results (from the past 2160 hours)  Lipid panel     Status: Abnormal   Collection Time: 10/03/23 12:13 PM  Result Value Ref Range   Cholesterol, Total 130 100 - 199 mg/dL   Triglycerides 409 (H) 0 - 149 mg/dL   HDL 39 (L) >81 mg/dL   VLDL Cholesterol Cal 28 5 - 40 mg/dL   LDL Chol Calc (NIH) 63 0 - 99 mg/dL   Chol/HDL Ratio 3.3 0.0 - 5.0 ratio    Comment:                                   T. Chol/HDL Ratio                                             Men  Women                               1/2 Avg.Risk  3.4    3.3                                   Avg.Risk  5.0    4.4                                2X Avg.Risk  9.6    7.1                                3X Avg.Risk 23.4   11.0       Assessment & Plan:  As per problem list.  Problem List Items Addressed This  Visit       Respiratory   Seasonal allergic rhinitis due to pollen   Relevant Medications   fluticasone  (FLONASE ) 50 MCG/ACT nasal spray     Endocrine   Controlled type 2 diabetes mellitus without complication, without long-term current use of insulin (HCC) - Primary   Relevant Orders   Hemoglobin A1c   POC CREATINE & ALBUMIN,URINE     Other   Mixed hyperlipidemia   Relevant Orders   Lipid panel   Hepatic function panel   CK   Other Visit Diagnoses       Benign prostatic hyperplasia without lower urinary tract symptoms       Relevant Medications   finasteride  (PROSCAR ) 5 MG tablet       Return in about 3 months (around 01/12/2024) for fu with labs prior.   Total time spent: 20 minutes  Arzella Bitters, MD  10/12/2023   This document may have been prepared by [facility] Voice Recognition software and as such may include unintentional dictation errors.

## 2023-10-14 ENCOUNTER — Ambulatory Visit: Payer: No Typology Code available for payment source | Admitting: Internal Medicine

## 2023-11-07 ENCOUNTER — Other Ambulatory Visit: Payer: Self-pay | Admitting: Internal Medicine

## 2023-11-07 DIAGNOSIS — B351 Tinea unguium: Secondary | ICD-10-CM

## 2023-12-07 ENCOUNTER — Other Ambulatory Visit: Payer: Self-pay | Admitting: Internal Medicine

## 2023-12-07 DIAGNOSIS — E785 Hyperlipidemia, unspecified: Secondary | ICD-10-CM

## 2023-12-12 ENCOUNTER — Other Ambulatory Visit: Payer: Self-pay | Admitting: Internal Medicine

## 2023-12-12 DIAGNOSIS — N400 Enlarged prostate without lower urinary tract symptoms: Secondary | ICD-10-CM

## 2023-12-12 DIAGNOSIS — J301 Allergic rhinitis due to pollen: Secondary | ICD-10-CM

## 2023-12-26 ENCOUNTER — Telehealth: Payer: Self-pay

## 2023-12-26 NOTE — Telephone Encounter (Signed)
 Pts wife informed.

## 2023-12-26 NOTE — Telephone Encounter (Signed)
 Please call the patients wife and let her know that the paper for her husband is ready for pick up

## 2024-01-09 ENCOUNTER — Other Ambulatory Visit

## 2024-01-10 LAB — HEPATIC FUNCTION PANEL
ALT: 23 IU/L (ref 0–44)
AST: 20 IU/L (ref 0–40)
Albumin: 4.4 g/dL (ref 3.8–4.9)
Alkaline Phosphatase: 85 IU/L (ref 44–121)
Bilirubin Total: 0.4 mg/dL (ref 0.0–1.2)
Bilirubin, Direct: 0.17 mg/dL (ref 0.00–0.40)
Total Protein: 7 g/dL (ref 6.0–8.5)

## 2024-01-10 LAB — LIPID PANEL
Chol/HDL Ratio: 3.8 ratio (ref 0.0–5.0)
Cholesterol, Total: 145 mg/dL (ref 100–199)
HDL: 38 mg/dL — ABNORMAL LOW (ref >39)
LDL Chol Calc (NIH): 72 mg/dL (ref 0–99)
Triglycerides: 210 mg/dL — ABNORMAL HIGH (ref 0–149)
VLDL Cholesterol Cal: 35 mg/dL (ref 5–40)

## 2024-01-11 ENCOUNTER — Other Ambulatory Visit: Payer: Self-pay | Admitting: Internal Medicine

## 2024-01-11 DIAGNOSIS — N400 Enlarged prostate without lower urinary tract symptoms: Secondary | ICD-10-CM

## 2024-01-13 ENCOUNTER — Ambulatory Visit: Admitting: Internal Medicine

## 2024-01-13 ENCOUNTER — Ambulatory Visit: Payer: Self-pay | Admitting: Internal Medicine

## 2024-01-13 VITALS — BP 132/86 | HR 64 | Temp 97.8°F | Ht 72.0 in | Wt 302.0 lb

## 2024-01-13 DIAGNOSIS — E782 Mixed hyperlipidemia: Secondary | ICD-10-CM | POA: Diagnosis not present

## 2024-01-13 DIAGNOSIS — E119 Type 2 diabetes mellitus without complications: Secondary | ICD-10-CM

## 2024-01-13 DIAGNOSIS — Z0130 Encounter for examination of blood pressure without abnormal findings: Secondary | ICD-10-CM

## 2024-01-13 NOTE — Progress Notes (Signed)
 Established Patient Office Visit  Subjective:  Patient ID: Douglas Collier, male    DOB: March 16, 1966  Age: 58 y.o. MRN: [id number]  Chief Complaint  Patient presents with   Follow-up    3 month lab results     No new complaints, here for lab review and medication refills. LDL and TC well controlled on lab review. Triglycerides also satisfactory  but A1c not done.    No other concerns at this time.   Past Medical History:  Diagnosis Date   BPH (benign prostatic hyperplasia)     Past Surgical History:  Procedure Laterality Date   Arm surgery Left    radius/ulnar    Social History   Socioeconomic History   Marital status: Married    Spouse name: Not on file   Number of children: Not on file   Years of education: Not on file   Highest education level: Not on file  Occupational History   Not on file  Tobacco Use   Smoking status: Former    Types: Cigarettes   Smokeless tobacco: Never  Substance and Sexual Activity   Alcohol use: No    Alcohol/week: 0.0 standard drinks of alcohol   Drug use: No   Sexual activity: Yes  Other Topics Concern   Not on file  Social History Narrative   Not on file   Social Drivers of Health   Financial Resource Strain: Not on file  Food Insecurity: Not on file  Transportation Needs: Not on file  Physical Activity: Not on file  Stress: Not on file  Social Connections: Not on file  Intimate Partner Violence: Not on file    Family History  Problem Relation Age of Onset   Other Mother        Genetic Disorder also 2 Brothers   Cancer Neg Hx        Kidney Disease    Allergies  Allergen Reactions   Codeine Rash    Outpatient Medications Prior to Visit  Medication Sig   atorvastatin (LIPITOR) 10 MG tablet TAKE 1 TABLET BY MOUTH EVERY DAY IN THE EVENING   cetirizine  (ZYRTEC ) 10 MG tablet TAKE 1 TABLET BY MOUTH EVERY DAY   finasteride  (PROSCAR ) 5 MG tablet TAKE 1 TABLET BY MOUTH EVERY DAY IN THE MORNING   fluticasone   (FLONASE ) 50 MCG/ACT nasal spray Place 1 spray into both nostrils daily. (Patient taking differently: Place 1 spray into both nostrils as needed.)   nebivolol (BYSTOLIC) 5 MG tablet TAKE 1 TABLET BY MOUTH EVERY DAY   tamsulosin  (FLOMAX ) 0.4 MG CAPS capsule TAKE 1 CAPSULE BY MOUTH EVERY DAY IN THE MORNING   terbinafine  (LAMISIL ) 250 MG tablet TAKE 1 TABLET BY MOUTH EVERY DAY   No facility-administered medications prior to visit.    Review of Systems  Constitutional:  Negative for weight loss (gained 8 lbs).  HENT: Negative.    Eyes: Negative.   Respiratory: Negative.    Cardiovascular: Negative.   Gastrointestinal: Negative.   Genitourinary: Negative.   Musculoskeletal:  Positive for joint pain.  Skin: Negative.   Neurological: Negative.   Endo/Heme/Allergies: Negative.   Psychiatric/Behavioral: Negative.         Objective:   BP 132/86   Pulse 64   Temp 97.8 F (36.6 C)   Ht 6' (1.829 m)   Wt (!) 302 lb (137 kg)   SpO2 99%   BMI 40.96 kg/m   Vitals:   01/13/24 1539  BP: 132/86  Pulse: 64  Temp: 97.8 F (36.6 C)  Height: 6' (1.829 m)  Weight: (!) 302 lb (137 kg)  SpO2: 99%  BMI (Calculated): 40.95    Physical Exam Vitals reviewed.  Constitutional:      General: He is not in acute distress.    Appearance: Normal appearance. He is obese.  HENT:     Head: Normocephalic.     Left Ear: There is no impacted cerumen.     Nose: Nose normal.     Mouth/Throat:     Mouth: Mucous membranes are moist.  Eyes:     Extraocular Movements: Extraocular movements intact.     Pupils: Pupils are equal, round, and reactive to light.  Cardiovascular:     Rate and Rhythm: Regular rhythm.     Chest Wall: PMI is not displaced.     Pulses: Normal pulses.     Heart sounds: Normal heart sounds. No murmur heard.    No S3 sounds.  Pulmonary:     Effort: Pulmonary effort is normal.     Breath sounds: Normal air entry. No rhonchi or rales.  Abdominal:     General: Abdomen is  flat. Bowel sounds are normal. There is no distension.     Palpations: Abdomen is soft. There is no hepatomegaly, splenomegaly or mass.     Tenderness: There is no abdominal tenderness.  Musculoskeletal:        General: Normal range of motion.     Cervical back: Normal range of motion and neck supple.     Right lower leg: 1+ Pitting Edema present.     Left lower leg: 1+ Pitting Edema present.  Skin:    General: Skin is warm and dry.  Neurological:     General: No focal deficit present.     Mental Status: He is alert and oriented to person, place, and time.     Cranial Nerves: No cranial nerve deficit.     Motor: No weakness.  Psychiatric:        Mood and Affect: Mood normal.        Behavior: Behavior normal.      No results found for any visits on 01/13/24.  Recent Results (from the past 2160 hours)  Lipid panel     Status: Abnormal   Collection Time: 01/09/24  1:56 PM  Result Value Ref Range   Cholesterol, Total 145 100 - 199 mg/dL   Triglycerides 789 (H) 0 - 149 mg/dL   HDL 38 (L) >60 mg/dL   VLDL Cholesterol Cal 35 5 - 40 mg/dL   LDL Chol Calc (NIH) 72 0 - 99 mg/dL   Chol/HDL Ratio 3.8 0.0 - 5.0 ratio    Comment:                                   T. Chol/HDL Ratio                                             Men  Women                               1/2 Avg.Risk  3.4    3.3  Avg.Risk  5.0    4.4                                2X Avg.Risk  9.6    7.1                                3X Avg.Risk 23.4   11.0   Hepatic function panel     Status: None   Collection Time: 01/09/24  1:56 PM  Result Value Ref Range   Total Protein 7.0 6.0 - 8.5 g/dL   Albumin 4.4 3.8 - 4.9 g/dL   Bilirubin Total 0.4 0.0 - 1.2 mg/dL   Bilirubin, Direct 9.82 0.00 - 0.40 mg/dL   Alkaline Phosphatase 85 44 - 121 IU/L   AST 20 0 - 40 IU/L   ALT 23 0 - 44 IU/L      Assessment & Plan:   Problem List Items Addressed This Visit       Endocrine   Controlled  type 2 diabetes mellitus without complication, without long-term current use of insulin (HCC)     Other   Mixed hyperlipidemia - Primary   Relevant Orders   Lipid panel    Return in about 3 months (around 04/14/2024) for fu with labs prior.   Total time spent: 20 minutes  Sherrill Cinderella Perry, MD  01/13/2024   This document may have been prepared by [facility] Voice Recognition software and as such may include unintentional dictation errors.

## 2024-01-14 LAB — HEMOGLOBIN A1C
Est. average glucose Bld gHb Est-mCnc: 134 mg/dL
Hgb A1c MFr Bld: 6.3 % — ABNORMAL HIGH (ref 4.8–5.6)

## 2024-02-02 ENCOUNTER — Other Ambulatory Visit: Payer: Self-pay | Admitting: Internal Medicine

## 2024-02-02 DIAGNOSIS — B351 Tinea unguium: Secondary | ICD-10-CM

## 2024-02-09 ENCOUNTER — Other Ambulatory Visit: Payer: Self-pay | Admitting: Internal Medicine

## 2024-02-09 DIAGNOSIS — J301 Allergic rhinitis due to pollen: Secondary | ICD-10-CM

## 2024-04-18 ENCOUNTER — Other Ambulatory Visit

## 2024-04-18 DIAGNOSIS — E119 Type 2 diabetes mellitus without complications: Secondary | ICD-10-CM

## 2024-04-18 DIAGNOSIS — E782 Mixed hyperlipidemia: Secondary | ICD-10-CM

## 2024-04-18 LAB — HEMOGLOBIN A1C
Est. average glucose Bld gHb Est-mCnc: 137 mg/dL
Hgb A1c MFr Bld: 6.4 % — ABNORMAL HIGH (ref 4.8–5.6)

## 2024-04-19 LAB — LIPID PANEL
Chol/HDL Ratio: 3.2 ratio (ref 0.0–5.0)
Cholesterol, Total: 132 mg/dL (ref 100–199)
HDL: 41 mg/dL (ref >39)
LDL Chol Calc (NIH): 66 mg/dL (ref 0–99)
Triglycerides: 143 mg/dL (ref 0–149)
VLDL Cholesterol Cal: 25 mg/dL (ref 5–40)

## 2024-04-23 ENCOUNTER — Telehealth: Payer: Self-pay | Admitting: Internal Medicine

## 2024-04-23 ENCOUNTER — Other Ambulatory Visit: Payer: Self-pay | Admitting: Internal Medicine

## 2024-04-23 DIAGNOSIS — I10 Essential (primary) hypertension: Secondary | ICD-10-CM

## 2024-04-23 NOTE — Telephone Encounter (Signed)
 PT wife came in stating pt had been out of  NEBIVOLOL since 04/18/24, please send refill to [facility] in [place]

## 2024-04-24 ENCOUNTER — Other Ambulatory Visit: Payer: Self-pay

## 2024-04-24 DIAGNOSIS — I10 Essential (primary) hypertension: Secondary | ICD-10-CM

## 2024-04-24 NOTE — Telephone Encounter (Signed)
 Request sent to [facility]

## 2024-04-27 ENCOUNTER — Ambulatory Visit: Admitting: Internal Medicine

## 2024-04-27 ENCOUNTER — Encounter: Payer: Self-pay | Admitting: Internal Medicine

## 2024-04-27 VITALS — BP 132/88 | HR 74 | Ht 72.0 in | Wt 309.4 lb

## 2024-04-27 DIAGNOSIS — Z1331 Encounter for screening for depression: Secondary | ICD-10-CM | POA: Diagnosis not present

## 2024-04-27 DIAGNOSIS — Z0130 Encounter for examination of blood pressure without abnormal findings: Secondary | ICD-10-CM

## 2024-04-27 DIAGNOSIS — Z23 Encounter for immunization: Secondary | ICD-10-CM

## 2024-04-27 DIAGNOSIS — E1165 Type 2 diabetes mellitus with hyperglycemia: Secondary | ICD-10-CM

## 2024-04-27 DIAGNOSIS — Z0001 Encounter for general adult medical examination with abnormal findings: Secondary | ICD-10-CM

## 2024-04-27 DIAGNOSIS — E119 Type 2 diabetes mellitus without complications: Secondary | ICD-10-CM

## 2024-04-27 LAB — POCT CBG (FASTING - GLUCOSE)-MANUAL ENTRY: Glucose Fasting, POC: 108 mg/dL — AB (ref 70–99)

## 2024-04-27 NOTE — Progress Notes (Signed)
 Established Patient Office Visit  Subjective:  Patient ID: Douglas Collier, male    DOB: 07-09-1965  Age: 58 y.o. MRN: [id number]  Chief Complaint  Patient presents with   Annual Exam    3 month, CPE, lab results    Here for CPE, lab review and medication refills. No new complaints, labs reviewed and notable for well controlled diabetes, A1c  at target, lipids at target. Denies any hypoglycemic episodes and home bg readings have been at target.     No other concerns at this time.   Past Medical History:  Diagnosis Date   BPH (benign prostatic hyperplasia)     Past Surgical History:  Procedure Laterality Date   Arm surgery Left    radius/ulnar    Social History   Socioeconomic History   Marital status: Married    Spouse name: Not on file   Number of children: Not on file   Years of education: Not on file   Highest education level: Not on file  Occupational History   Not on file  Tobacco Use   Smoking status: Former    Types: Cigarettes   Smokeless tobacco: Never  Substance and Sexual Activity   Alcohol use: No    Alcohol/week: 0.0 standard drinks of alcohol   Drug use: No   Sexual activity: Yes  Other Topics Concern   Not on file  Social History Narrative   Not on file   Social Drivers of Health   Financial Resource Strain: Not on file  Food Insecurity: Not on file  Transportation Needs: Not on file  Physical Activity: Not on file  Stress: Not on file  Social Connections: Not on file  Intimate Partner Violence: Not on file    Family History  Problem Relation Age of Onset   Other Mother        Genetic Disorder also 2 Brothers   Cancer Neg Hx        Kidney Disease    Allergies  Allergen Reactions   Codeine Rash    Outpatient Medications Prior to Visit  Medication Sig   atorvastatin (LIPITOR) 10 MG tablet TAKE 1 TABLET BY MOUTH EVERY DAY IN THE EVENING   cetirizine  (ZYRTEC ) 10 MG tablet TAKE 1 TABLET BY MOUTH EVERY DAY   finasteride   (PROSCAR ) 5 MG tablet TAKE 1 TABLET BY MOUTH EVERY DAY IN THE MORNING   fluticasone  (FLONASE ) 50 MCG/ACT nasal spray SPRAY 1 SPRAY INTO BOTH NOSTRILS DAILY.   nebivolol (BYSTOLIC) 5 MG tablet TAKE 1 TABLET BY MOUTH EVERY DAY   tamsulosin  (FLOMAX ) 0.4 MG CAPS capsule TAKE 1 CAPSULE BY MOUTH EVERY DAY IN THE MORNING   terbinafine  (LAMISIL ) 250 MG tablet TAKE 1 TABLET BY MOUTH EVERY DAY   No facility-administered medications prior to visit.    Review of Systems  Constitutional:  Negative for weight loss (gained 7 lbs).  HENT: Negative.    Eyes: Negative.   Respiratory: Negative.    Cardiovascular: Negative.   Gastrointestinal: Negative.   Genitourinary: Negative.   Musculoskeletal:  Positive for joint pain.  Skin: Negative.   Neurological: Negative.   Endo/Heme/Allergies: Negative.   Psychiatric/Behavioral: Negative.         Objective:   BP 132/88   Pulse 74   Ht 6' (1.829 m)   Wt (!) 309 lb 6.4 oz (140.3 kg)   SpO2 96%   BMI 41.96 kg/m   Vitals:   04/27/24 1350  BP: 132/88  Pulse: 74  Height:  6' (1.829 m)  Weight: (!) 309 lb 6.4 oz (140.3 kg)  SpO2: 96%  BMI (Calculated): 41.95    Physical Exam Vitals reviewed.  Constitutional:      General: He is not in acute distress.    Appearance: Normal appearance. He is obese.  HENT:     Head: Normocephalic.     Left Ear: There is no impacted cerumen.     Nose: Nose normal.     Mouth/Throat:     Mouth: Mucous membranes are moist.  Eyes:     Extraocular Movements: Extraocular movements intact.     Pupils: Pupils are equal, round, and reactive to light.  Cardiovascular:     Rate and Rhythm: Regular rhythm.     Chest Wall: PMI is not displaced.     Pulses: Normal pulses.     Heart sounds: Normal heart sounds. No murmur heard.    No S3 sounds.  Pulmonary:     Effort: Pulmonary effort is normal.     Breath sounds: Normal air entry. No rhonchi or rales.  Abdominal:     General: Abdomen is flat. Bowel sounds are  normal. There is no distension.     Palpations: Abdomen is soft. There is no hepatomegaly, splenomegaly or mass.     Tenderness: There is no abdominal tenderness.     Hernia: A hernia is present. Hernia is present in the ventral area.  Musculoskeletal:        General: Normal range of motion.     Cervical back: Normal range of motion and neck supple.     Right lower leg: 1+ Pitting Edema present.     Left lower leg: 1+ Pitting Edema present.  Skin:    General: Skin is warm and dry.  Neurological:     General: No focal deficit present.     Mental Status: He is alert and oriented to person, place, and time.     Cranial Nerves: No cranial nerve deficit.     Motor: No weakness.  Psychiatric:        Mood and Affect: Mood normal.        Behavior: Behavior normal.      Results for orders placed or performed in visit on 04/27/24  POCT CBG (Fasting - Glucose)  Result Value Ref Range   Glucose Fasting, POC 108 (A) 70 - 99 mg/dL    Recent Results (from the past 2160 hours)  Lipid panel     Status: None   Collection Time: 04/18/24  1:28 PM  Result Value Ref Range   Cholesterol, Total 132 100 - 199 mg/dL   Triglycerides 856 0 - 149 mg/dL   HDL 41 >60 mg/dL   VLDL Cholesterol Cal 25 5 - 40 mg/dL   LDL Chol Calc (NIH) 66 0 - 99 mg/dL   Chol/HDL Ratio 3.2 0.0 - 5.0 ratio    Comment:                                   T. Chol/HDL Ratio                                             Men  Women  1/2 Avg.Risk  3.4    3.3                                   Avg.Risk  5.0    4.4                                2X Avg.Risk  9.6    7.1                                3X Avg.Risk 23.4   11.0   Hemoglobin A1c     Status: Abnormal   Collection Time: 04/18/24  1:28 PM  Result Value Ref Range   Hgb A1c MFr Bld 6.4 (H) 4.8 - 5.6 %    Comment:          Prediabetes: 5.7 - 6.4          Diabetes: >6.4          Glycemic control for adults with diabetes: <7.0    Est. average  glucose Bld gHb Est-mCnc 137 mg/dL  POCT CBG (Fasting - Glucose)     Status: Abnormal   Collection Time: 04/27/24  1:59 PM  Result Value Ref Range   Glucose Fasting, POC 108 (A) 70 - 99 mg/dL      Assessment & Plan:  Douglas Collier was seen today for annual exam.  Controlled type 2 diabetes mellitus without complication, without long-term current use of insulin (HCC) -     POCT CBG (Fasting - Glucose) -     Comprehensive metabolic panel with GFR; Future  Needs flu shot -     Flu Vaccine QUAD with presevative  Encounter for general adult medical examination with abnormal findings    Problem List Items Addressed This Visit       Endocrine   Controlled type 2 diabetes mellitus without complication, without long-term current use of insulin (HCC) - Primary   Relevant Orders   POCT CBG (Fasting - Glucose) (Completed)   Comprehensive metabolic panel with GFR   Other Visit Diagnoses       Needs flu shot       Relevant Orders   Flu Vaccine QUAD with presevative (Fluzone Quad)     Encounter for general adult medical examination with abnormal findings           Return in 3 months (on 07/26/2024) for fu with labs prior.   Total time spent: 30 minutes. This time includes review of previous notes and results and patient face to face interaction during today'Donae Kueker visit.    Sherrill Cinderella Perry, MD  04/27/2024   This document may have been prepared by [facility] Voice Recognition software and as such may include unintentional dictation errors.

## 2024-05-04 ENCOUNTER — Other Ambulatory Visit: Payer: Self-pay | Admitting: Internal Medicine

## 2024-05-04 DIAGNOSIS — B351 Tinea unguium: Secondary | ICD-10-CM

## 2024-05-25 ENCOUNTER — Other Ambulatory Visit: Payer: Self-pay | Admitting: Internal Medicine

## 2024-05-25 DIAGNOSIS — E785 Hyperlipidemia, unspecified: Secondary | ICD-10-CM

## 2024-06-14 ENCOUNTER — Other Ambulatory Visit: Payer: Self-pay | Admitting: Internal Medicine

## 2024-06-14 DIAGNOSIS — J301 Allergic rhinitis due to pollen: Secondary | ICD-10-CM

## 2024-06-15 ENCOUNTER — Other Ambulatory Visit: Payer: Self-pay | Admitting: Internal Medicine

## 2024-06-15 DIAGNOSIS — N400 Enlarged prostate without lower urinary tract symptoms: Secondary | ICD-10-CM

## 2024-06-29 ENCOUNTER — Other Ambulatory Visit: Payer: Self-pay | Admitting: Internal Medicine

## 2024-06-29 DIAGNOSIS — N400 Enlarged prostate without lower urinary tract symptoms: Secondary | ICD-10-CM

## 2024-07-27 ENCOUNTER — Ambulatory Visit: Admitting: Internal Medicine
